# Patient Record
Sex: Male | Born: 1960 | Race: White | Hispanic: No | Smoking: Former smoker
Health system: Southern US, Community
[De-identification: ages and names within clinical notes are randomized; demographics above are authoritative.]

## PROBLEM LIST (undated history)

## (undated) DIAGNOSIS — J449 Chronic obstructive pulmonary disease, unspecified: Secondary | ICD-10-CM

## (undated) DIAGNOSIS — E785 Hyperlipidemia, unspecified: Secondary | ICD-10-CM

## (undated) DIAGNOSIS — J45909 Unspecified asthma, uncomplicated: Secondary | ICD-10-CM

## (undated) DIAGNOSIS — J439 Emphysema, unspecified: Secondary | ICD-10-CM

## (undated) HISTORY — DX: Emphysema, unspecified: J43.9

## (undated) HISTORY — DX: Hyperlipidemia, unspecified: E78.5

## (undated) HISTORY — PX: CARDIAC CATHETERIZATION: SHX172

---

## 2014-07-04 HISTORY — PX: COLONOSCOPY W/ BIOPSIES: SHX1374

## 2015-04-25 ENCOUNTER — Emergency Department (HOSPITAL_COMMUNITY): Payer: Self-pay

## 2015-04-25 ENCOUNTER — Emergency Department (HOSPITAL_COMMUNITY)
Admission: EM | Admit: 2015-04-25 | Discharge: 2015-04-25 | Disposition: A | Discharge status: Home / Self Care | Payer: Self-pay | Attending: Emergency Medicine | Admitting: Emergency Medicine

## 2015-04-25 ENCOUNTER — Encounter (HOSPITAL_COMMUNITY): Payer: Self-pay | Admitting: Emergency Medicine

## 2015-04-25 DIAGNOSIS — Z87891 Personal history of nicotine dependence: Secondary | ICD-10-CM | POA: Insufficient documentation

## 2015-04-25 DIAGNOSIS — Z9889 Other specified postprocedural states: Secondary | ICD-10-CM | POA: Insufficient documentation

## 2015-04-25 DIAGNOSIS — J449 Chronic obstructive pulmonary disease, unspecified: Secondary | ICD-10-CM | POA: Insufficient documentation

## 2015-04-25 DIAGNOSIS — R61 Generalized hyperhidrosis: Secondary | ICD-10-CM | POA: Insufficient documentation

## 2015-04-25 DIAGNOSIS — R42 Dizziness and giddiness: Secondary | ICD-10-CM | POA: Insufficient documentation

## 2015-04-25 DIAGNOSIS — R231 Pallor: Secondary | ICD-10-CM | POA: Insufficient documentation

## 2015-04-25 HISTORY — DX: Chronic obstructive pulmonary disease, unspecified: J44.9

## 2015-04-25 HISTORY — DX: Unspecified asthma, uncomplicated: J45.909

## 2015-04-25 LAB — CBC WITH DIFFERENTIAL/PLATELET
BASOS ABS: 0 10*3/uL (ref 0.0–0.1)
BASOS PCT: 0 %
Eosinophils Absolute: 0.3 10*3/uL (ref 0.0–0.7)
Eosinophils Relative: 3 %
HEMATOCRIT: 44.2 % (ref 39.0–52.0)
HEMOGLOBIN: 15.2 g/dL (ref 13.0–17.0)
LYMPHS PCT: 30 %
Lymphs Abs: 3 10*3/uL (ref 0.7–4.0)
MCH: 29.3 pg (ref 26.0–34.0)
MCHC: 34.4 g/dL (ref 30.0–36.0)
MCV: 85.2 fL (ref 78.0–100.0)
Monocytes Absolute: 0.8 10*3/uL (ref 0.1–1.0)
Monocytes Relative: 8 %
NEUTROS ABS: 5.8 10*3/uL (ref 1.7–7.7)
NEUTROS PCT: 59 %
Platelets: 175 10*3/uL (ref 150–400)
RBC: 5.19 MIL/uL (ref 4.22–5.81)
RDW: 12.7 % (ref 11.5–15.5)
WBC: 9.9 10*3/uL (ref 4.0–10.5)

## 2015-04-25 LAB — COMPREHENSIVE METABOLIC PANEL
ALBUMIN: 3.9 g/dL (ref 3.5–5.0)
ALT: 21 U/L (ref 17–63)
AST: 26 U/L (ref 15–41)
Alkaline Phosphatase: 57 U/L (ref 38–126)
Anion gap: 13 (ref 5–15)
BUN: 8 mg/dL (ref 6–20)
CHLORIDE: 104 mmol/L (ref 101–111)
CO2: 24 mmol/L (ref 22–32)
Calcium: 9.6 mg/dL (ref 8.9–10.3)
Creatinine, Ser: 1.07 mg/dL (ref 0.61–1.24)
Glucose, Bld: 124 mg/dL — ABNORMAL HIGH (ref 65–99)
POTASSIUM: 3.7 mmol/L (ref 3.5–5.1)
Sodium: 141 mmol/L (ref 135–145)
Total Bilirubin: 0.2 mg/dL — ABNORMAL LOW (ref 0.3–1.2)
Total Protein: 6.8 g/dL (ref 6.5–8.1)

## 2015-04-25 LAB — LIPASE, BLOOD: LIPASE: 29 U/L (ref 11–51)

## 2015-04-25 MED ORDER — SODIUM CHLORIDE 0.9 % IV BOLUS (SEPSIS)
1000.0000 mL | Freq: Once | INTRAVENOUS | Status: AC
  Administered 2015-04-25: 1000 mL via INTRAVENOUS

## 2015-04-25 NOTE — Discharge Instructions (Signed)
Dizziness John Banks, your blood work, EKG and chest xray today were normal.  Be sure to stay well hydrated. See your primary care doctor within 3 days for close follow up. If symptoms worsen, come back to the ED immediately. Thank you. Dizziness is a common problem. It makes you feel unsteady or lightheaded. You may feel like you are about to pass out (faint). Dizziness can lead to injury if you stumble or fall. Anyone can get dizzy, but dizziness is more common in older adults. This condition can be caused by a number of things, including:  Medicines.  Dehydration.  Illness. HOME CARE Following these instructions may help with your condition: Eating and Drinking  Drink enough fluid to keep your pee (urine) clear or pale yellow. This helps to keep you from getting dehydrated. Try to drink more clear fluids, such as water.  Do not drink alcohol.  Limit how much caffeine you drink or eat if told by your doctor.  Limit how much salt you drink or eat if told by your doctor. Activity  Avoid making quick movements.  When you stand up from sitting in a chair, steady yourself until you feel okay.  In the morning, first sit up on the side of the bed. When you feel okay, stand slowly while you hold onto something. Do this until you know that your balance is fine.  Move your legs often if you need to stand in one place for a long time. Tighten and relax your muscles in your legs while you are standing.  Do not drive or use heavy machinery if you feel dizzy.  Avoid bending down if you feel dizzy. Place items in your home so that they are easy for you to reach without leaning over. Lifestyle  Do not use any tobacco products, including cigarettes, chewing tobacco, or electronic cigarettes. If you need help quitting, ask your doctor.  Try to lower your stress level, such as with yoga or meditation. Talk with your doctor if you need help. General Instructions  Watch your dizziness for any  changes.  Take medicines only as told by your doctor. Talk with your doctor if you think that your dizziness is caused by a medicine that you are taking.  Tell a friend or a family member that you are feeling dizzy. If he or she notices any changes in your behavior, have this person call your doctor.  Keep all follow-up visits as told by your doctor. This is important. GET HELP IF:  Your dizziness does not go away.  Your dizziness or light-headedness gets worse.  You feel sick to your stomach (nauseous).  You have trouble hearing.  You have new symptoms.  You are unsteady on your feet or you feel like the room is spinning. GET HELP RIGHT AWAY IF:  You throw up (vomit) or have diarrhea and are unable to eat or drink anything.  You have trouble:  Talking.  Walking.  Swallowing.  Using your arms, hands, or legs.  You feel generally weak.  You are not thinking clearly or you have trouble forming sentences. It may take a friend or family member to notice this.  You have:  Chest pain.  Pain in your belly (abdomen).  Shortness of breath.  Sweating.  Your vision changes.  You are bleeding.  You have a headache.  You have neck pain or a stiff neck.  You have a fever.   This information is not intended to replace advice given to  you by your health care provider. Make sure you discuss any questions you have with your health care provider.   Document Released: 12/09/2010 Document Revised: 05/06/2014 Document Reviewed: 12/16/2013 Elsevier Interactive Patient Education [employer].

## 2015-04-25 NOTE — ED Notes (Signed)
Patient was visitor in the next room watching wife gets stitches - began to experience dizziness, diaphoresis, feeling like he was about to pass out. Patient moved to private room, layed down and states he is feeling better. Pt had cardiac cath 4/18 s/p chest pain. Pt A&O x 4.

## 2015-04-25 NOTE — ED Provider Notes (Addendum)
CSN: [id number]     Arrival date & time 04/25/15  7829 History  By signing my name below, I, Phillis Haggis, attest that this documentation has been prepared under the direction and in the presence of Tomasita Crumble, MD. Electronically Signed: Phillis Haggis, ED Scribe. 04/25/2015. 3:42 AM.   Chief Complaint  Patient presents with  . Near Syncope   The history is provided by the patient. No language interpreter was used.  HPI Comments: John Banks is a 55 y.o. Male with a hx of COPD and asthma who presents to the Emergency Department complaining of sudden onset near syncope onset 30 minutes ago. Pt was in the ED with his wife who was receiving stitches. He states that he began to experience dizziness, diaphoresis, pallor and felt like he was going to pass out. He was moved to a separate room in the ED and states that he now feels better. Pt reports hx of cardiac catheterization. He denies other symptoms.   Past Medical History  Diagnosis Date  . COPD (chronic obstructive pulmonary disease) (HCC)   . Asthma    Past Surgical History  Procedure Laterality Date  . Cardiac catheterization     History reviewed. No pertinent family history. Social History  Substance Use Topics  . Smoking status: Former Smoker    Types: Cigarettes    Quit date: 04/19/2014  . Smokeless tobacco: Current User  . Alcohol Use: Yes     Comment: occ    Review of Systems 10 Systems reviewed and all are negative for acute change except as noted in the HPI.  Allergies  Review of patient's allergies indicates no known allergies.  Home Medications   Prior to Admission medications   Not on File   BP 105/75 mmHg  Pulse 76  Temp(Src) 97.6 F (36.4 C) (Oral)  Resp 11  Ht  (1.854 m)  Wt 157 lb (71.215 kg)  BMI 20.72 kg/m2  SpO2 94% Physical Exam  Constitutional: He is oriented to person, place, and time. Vital signs are normal. He appears well-developed and well-nourished.  Non-toxic appearance. He  does not appear ill. No distress.  HENT:  Head: Normocephalic and atraumatic.  Nose: Nose normal.  Mouth/Throat: Oropharynx is clear and moist. No oropharyngeal exudate.  Eyes: Conjunctivae and EOM are normal. Pupils are equal, round, and reactive to light. No scleral icterus.  Neck: Normal range of motion. Neck supple. No tracheal deviation, no edema, no erythema and normal range of motion present. No thyroid mass and no thyromegaly present.  Cardiovascular: Normal rate, regular rhythm, S1 normal, S2 normal, normal heart sounds, intact distal pulses and normal pulses.  Exam reveals no gallop and no friction rub.   No murmur heard. Pulmonary/Chest: Effort normal and breath sounds normal. No respiratory distress. He has no wheezes. He has no rhonchi. He has no rales.  Abdominal: Soft. Normal appearance and bowel sounds are normal. He exhibits no distension, no ascites and no mass. There is no hepatosplenomegaly. There is no tenderness. There is no rebound, no guarding and no CVA tenderness.  Musculoskeletal: Normal range of motion. He exhibits no edema or tenderness.  Lymphadenopathy:    He has no cervical adenopathy.  Neurological: He is alert and oriented to person, place, and time. He has normal strength. No cranial nerve deficit or sensory deficit.  Skin: Skin is warm, dry and intact. No petechiae and no rash noted. He is not diaphoretic. No erythema. No pallor.  Psychiatric: He has a normal mood  and affect. His behavior is normal. Judgment normal.  Nursing note and vitals reviewed.   ED Course  Procedures (including critical care time) DIAGNOSTIC STUDIES: Oxygen Saturation is 94% on RA, adequate by my interpretation.    COORDINATION OF CARE: 3:39 AM-Discussed treatment plan which includes IV fluids with pt at bedside and pt agreed to plan.    Labs Review Labs Reviewed  COMPREHENSIVE METABOLIC PANEL - Abnormal; Notable for the following:    Glucose, Bld 124 (*)    Total Bilirubin  0.2 (*)    All other components within normal limits  CBC WITH DIFFERENTIAL/PLATELET  LIPASE, BLOOD    Imaging Review Dg Chest 2 View  04/25/2015  CLINICAL DATA:  Near syncope. Dizziness in our ago. Recent cardiac catheterization. EXAM: CHEST  2 VIEW COMPARISON:  None. FINDINGS: Normal heart size and pulmonary vascularity. Hyperinflation of the lungs with increased interstitial markings. This could indicate asthma or emphysema with chronic bronchitis. No focal airspace disease or consolidation in the lungs. No blunting of costophrenic angles. No pneumothorax. Mediastinal contours appear intact. IMPRESSION: Hyperinflation of the lungs with increased interstitial markings suggesting asthma or emphysema with chronic bronchitis. Electronically Signed   By: Burman NievesWilliam  Stevens M.D.   On: 04/25/2015 06:00   I have personally reviewed and evaluated these images and lab results as part of my medical decision-making.   EKG Interpretation   Date/Time:  Saturday April 25 2015 03:28:58 EDT Ventricular Rate:  75 PR Interval:  157 QRS Duration: 103 QT Interval:  403 QTC Calculation: 450 R Axis:   91 Text Interpretation:  Sinus rhythm Consider right ventricular hypertrophy  No old tracing to compare Confirmed by Erroll Lunani, Shayma Pfefferle [place]le [phone]([id number]) on  04/25/2015 3:49:24 AM      MDM   Final diagnoses:  None    Patient presents to the emergency department for lightheadedness and dizziness after seeing his wife's laceration repair. He is pale diaphoretic and has heart disease. EKG does not show any ischemia. He was given IV fluids, will obtain chest x-ray. Laboratories is unremarkable.  5:20 AM CXR neg for abnormality. He is back to baseline, appears well and in NAD.  VS remain within his normal limits and he is safe for DC.  I personally performed the services described in this documentation, which was scribed in my presence. The recorded information has been reviewed and is accurate.        Tomasita CrumbleAdeleke  Hema Lanza, MD 04/25/15 [phone]

## 2015-04-29 ENCOUNTER — Ambulatory Visit (INDEPENDENT_AMBULATORY_CARE_PROVIDER_SITE_OTHER): Payer: Self-pay | Admitting: Family Medicine

## 2015-04-29 ENCOUNTER — Encounter (INDEPENDENT_AMBULATORY_CARE_PROVIDER_SITE_OTHER): Payer: Self-pay

## 2015-04-29 ENCOUNTER — Encounter: Payer: Self-pay | Admitting: Family Medicine

## 2015-04-29 VITALS — BP 111/72 | HR 54 | Temp 97.2°F | Ht 73.0 in | Wt 153.8 lb

## 2015-04-29 DIAGNOSIS — E785 Hyperlipidemia, unspecified: Secondary | ICD-10-CM | POA: Insufficient documentation

## 2015-04-29 DIAGNOSIS — Z720 Tobacco use: Secondary | ICD-10-CM

## 2015-04-29 DIAGNOSIS — F17200 Nicotine dependence, unspecified, uncomplicated: Secondary | ICD-10-CM

## 2015-04-29 DIAGNOSIS — I10 Essential (primary) hypertension: Secondary | ICD-10-CM | POA: Insufficient documentation

## 2015-04-29 MED ORDER — SIMVASTATIN 40 MG PO TABS: 40.0000 mg | ORAL_TABLET | Freq: Every day | ORAL | Status: DC

## 2015-04-29 MED ORDER — CHOLESTYRAMINE 4 GM/DOSE PO POWD: 4.0000 g | Freq: Two times a day (BID) | ORAL | Status: DC

## 2015-04-29 MED ORDER — METOPROLOL TARTRATE 25 MG PO TABS: 25.0000 mg | ORAL_TABLET | Freq: Two times a day (BID) | ORAL | Status: DC

## 2015-04-29 NOTE — Progress Notes (Signed)
BP 111/72 mmHg  Pulse 54  Temp(Src) 97.2 F (36.2 C) (Oral)  Ht '6\' 1"'$  (1.854 m)  Wt 153 lb 12.8 oz (69.763 kg)  BMI 20.30 kg/m2   Subjective:    Patient ID: John Banks, male    DOB: 1960-12-23, 55 y.o.   MRN: [id number]  HPI: Damont Balles is a 55 y.o. male presenting on 04/29/2015 for Establish Care   HPI Hypertension checkup Patient is coming in as a new patient to establish care with your office and is here for a blood pressure check. He is currently on metoprolol 25 mg twice daily and denies any issues with it. Patient denies headaches, blurred vision, chest pains, shortness of breath, or weakness. Denies any side effects from medication and is content with current medication. His blood pressure today is 111/72 and his heart rate is 54.  Hyperlipidemia Patient also comes in today to establish care for her cholesterol. He is currently on cholestyramine and simvastatin for his cholesterol. He denies any issues with medication and they have been doing well for him he says for quite some time.  Smoking Patient was a former smoker and quit about 8 or 9 days ago using nicotine patches and hopes to stay that way. Prior to this he smoked 2 packs a day for 40 years and has an 80-pack-year history. He denies any shortness of breath or wheezing.  Relevant past medical, surgical, family and social history reviewed and updated as indicated. Interim medical history since our last visit reviewed. Allergies and medications reviewed and updated.  Review of Systems  Constitutional: Negative for fever and chills.  HENT: Negative for ear discharge and ear pain.   Eyes: Negative for discharge and visual disturbance.  Respiratory: Negative for cough, shortness of breath and wheezing.   Cardiovascular: Negative for chest pain, palpitations and leg swelling.  Gastrointestinal: Negative for abdominal pain, diarrhea and constipation.  Genitourinary: Negative for difficulty urinating.    Musculoskeletal: Negative for back pain and gait problem.  Skin: Negative for rash.  Neurological: Negative for dizziness, syncope, light-headedness and headaches.  All other systems reviewed and are negative.   Per HPI unless specifically indicated above     Medication List       This list is accurate as of: 04/29/15 10:06 AM.  Always use your most recent med list.               cholestyramine 4 GM/DOSE powder  Commonly known as:  QUESTRAN  Take 1 packet (4 g total) by mouth 2 (two) times daily with a meal.     metoprolol tartrate 25 MG tablet  Commonly known as:  LOPRESSOR  Take 1 tablet (25 mg total) by mouth 2 (two) times daily.     nicotine 21 mg/24hr patch  Commonly known as:  NICODERM CQ - dosed in mg/24 hours  Place 21 mg onto the skin daily.     nitroGLYCERIN 0.4 MG SL tablet  Commonly known as:  NITROSTAT  Place 0.4 mg under the tongue every 5 (five) minutes as needed for chest pain.     simvastatin 40 MG tablet  Commonly known as:  ZOCOR  Take 1 tablet (40 mg total) by mouth daily.           Objective:    BP 111/72 mmHg  Pulse 54  Temp(Src) 97.2 F (36.2 C) (Oral)  Ht '6\' 1"'$  (1.854 m)  Wt 153 lb 12.8 oz (69.763 kg)  BMI 20.30 kg/m2  Wt  Readings from Last 3 Encounters:  04/29/15 153 lb 12.8 oz (69.763 kg)  04/25/15 157 lb (71.215 kg)    Physical Exam  Constitutional: He is oriented to person, place, and time. He appears well-developed and well-nourished. No distress.  Eyes: Conjunctivae and EOM are normal. Pupils are equal, round, and reactive to light. Right eye exhibits no discharge. No scleral icterus.  Neck: Neck supple. No thyromegaly present.  Cardiovascular: Normal rate, regular rhythm, normal heart sounds and intact distal pulses.   No murmur heard. Pulmonary/Chest: Effort normal and breath sounds normal. No respiratory distress. He has no wheezes.  Musculoskeletal: Normal range of motion. He exhibits no edema.  Lymphadenopathy:    He  has no cervical adenopathy.  Neurological: He is alert and oriented to person, place, and time. Coordination normal.  Skin: Skin is warm and dry. No rash noted. He is not diaphoretic.  Psychiatric: He has a normal mood and affect. His behavior is normal.  Vitals reviewed.   Results for orders placed or performed during the hospital encounter of 04/25/15  CBC with Differential/Platelet  Result Value Ref Range   WBC 9.9 4.0 - 10.5 K/uL   RBC 5.19 4.22 - 5.81 MIL/uL   Hemoglobin 15.2 13.0 - 17.0 g/dL   HCT 44.2 39.0 - 52.0 %   MCV 85.2 78.0 - 100.0 fL   MCH 29.3 26.0 - 34.0 pg   MCHC 34.4 30.0 - 36.0 g/dL   RDW 12.7 11.5 - 15.5 %   Platelets 175 150 - 400 K/uL   Neutrophils Relative % 59 %   Neutro Abs 5.8 1.7 - 7.7 K/uL   Lymphocytes Relative 30 %   Lymphs Abs 3.0 0.7 - 4.0 K/uL   Monocytes Relative 8 %   Monocytes Absolute 0.8 0.1 - 1.0 K/uL   Eosinophils Relative 3 %   Eosinophils Absolute 0.3 0.0 - 0.7 K/uL   Basophils Relative 0 %   Basophils Absolute 0.0 0.0 - 0.1 K/uL  Comprehensive metabolic panel  Result Value Ref Range   Sodium 141 135 - 145 mmol/L   Potassium 3.7 3.5 - 5.1 mmol/L   Chloride 104 101 - 111 mmol/L   CO2 24 22 - 32 mmol/L   Glucose, Bld 124 (H) 65 - 99 mg/dL   BUN 8 6 - 20 mg/dL   Creatinine, Ser 1.07 0.61 - 1.24 mg/dL   Calcium 9.6 8.9 - 10.3 mg/dL   Total Protein 6.8 6.5 - 8.1 g/dL   Albumin 3.9 3.5 - 5.0 g/dL   AST 26 15 - 41 U/L   ALT 21 17 - 63 U/L   Alkaline Phosphatase 57 38 - 126 U/L   Total Bilirubin 0.2 (L) 0.3 - 1.2 mg/dL   GFR calc non Af Amer >60 >60 mL/min   GFR calc Af Amer >60 >60 mL/min   Anion gap 13 5 - 15  Lipase, blood  Result Value Ref Range   Lipase 29 11 - 51 U/L      Assessment & Plan:   Problem List Items Addressed This Visit      Cardiovascular and Mediastinum   Essential hypertension, benign - Primary   Relevant Medications   nitroGLYCERIN (NITROSTAT) 0.4 MG SL tablet   cholestyramine (QUESTRAN) 4 GM/DOSE  powder   metoprolol tartrate (LOPRESSOR) 25 MG tablet   simvastatin (ZOCOR) 40 MG tablet   Other Relevant Orders   CMP14+EGFR     Other   Hyperlipidemia LDL goal <130   Relevant Medications  nitroGLYCERIN (NITROSTAT) 0.4 MG SL tablet   cholestyramine (QUESTRAN) 4 GM/DOSE powder   metoprolol tartrate (LOPRESSOR) 25 MG tablet   simvastatin (ZOCOR) 40 MG tablet   Other Relevant Orders   Lipid panel    Other Visit Diagnoses    Smoking            Follow up plan: Return if symptoms worsen or fail to improve.  Counseling provided for all of the vaccine components Orders Placed This Encounter  Procedures  . CMP14+EGFR  . Lipid panel    Caryl Pina, MD [facility] 04/29/2015, 10:06 AM

## 2015-05-05 ENCOUNTER — Telehealth: Payer: Self-pay | Admitting: Family Medicine

## 2015-05-05 NOTE — Telephone Encounter (Signed)
rx was switched from packets to the can of powder for patient.

## 2015-07-01 ENCOUNTER — Other Ambulatory Visit: Payer: Self-pay | Admitting: *Deleted

## 2015-07-01 DIAGNOSIS — I10 Essential (primary) hypertension: Secondary | ICD-10-CM

## 2015-07-01 DIAGNOSIS — E785 Hyperlipidemia, unspecified: Secondary | ICD-10-CM

## 2015-07-01 MED ORDER — METOPROLOL TARTRATE 25 MG PO TABS: 25.0000 mg | ORAL_TABLET | Freq: Two times a day (BID) | ORAL | Status: DC

## 2015-07-01 MED ORDER — SIMVASTATIN 40 MG PO TABS: 40.0000 mg | ORAL_TABLET | Freq: Every day | ORAL | Status: DC

## 2015-08-23 ENCOUNTER — Other Ambulatory Visit: Payer: Self-pay | Admitting: Family Medicine

## 2015-08-23 DIAGNOSIS — I10 Essential (primary) hypertension: Secondary | ICD-10-CM

## 2015-08-30 ENCOUNTER — Emergency Department (HOSPITAL_COMMUNITY): Payer: Self-pay

## 2015-08-30 ENCOUNTER — Inpatient Hospital Stay (HOSPITAL_COMMUNITY)
Admission: EM | Admit: 2015-08-30 | Discharge: 2015-09-01 | DRG: 369 | Disposition: A | Discharge status: Home / Self Care | Payer: Self-pay | Attending: Family Medicine | Admitting: Family Medicine

## 2015-08-30 ENCOUNTER — Encounter (HOSPITAL_COMMUNITY): Payer: Self-pay | Admitting: Emergency Medicine

## 2015-08-30 DIAGNOSIS — A09 Infectious gastroenteritis and colitis, unspecified: Secondary | ICD-10-CM | POA: Diagnosis present

## 2015-08-30 DIAGNOSIS — K226 Gastro-esophageal laceration-hemorrhage syndrome: Principal | ICD-10-CM | POA: Diagnosis present

## 2015-08-30 DIAGNOSIS — R1110 Vomiting, unspecified: Secondary | ICD-10-CM

## 2015-08-30 DIAGNOSIS — E785 Hyperlipidemia, unspecified: Secondary | ICD-10-CM | POA: Diagnosis present

## 2015-08-30 DIAGNOSIS — D751 Secondary polycythemia: Secondary | ICD-10-CM | POA: Diagnosis present

## 2015-08-30 DIAGNOSIS — I10 Essential (primary) hypertension: Secondary | ICD-10-CM | POA: Diagnosis present

## 2015-08-30 DIAGNOSIS — K920 Hematemesis: Secondary | ICD-10-CM

## 2015-08-30 DIAGNOSIS — K922 Gastrointestinal hemorrhage, unspecified: Secondary | ICD-10-CM | POA: Diagnosis present

## 2015-08-30 DIAGNOSIS — F17210 Nicotine dependence, cigarettes, uncomplicated: Secondary | ICD-10-CM | POA: Diagnosis present

## 2015-08-30 DIAGNOSIS — Z7982 Long term (current) use of aspirin: Secondary | ICD-10-CM

## 2015-08-30 DIAGNOSIS — D72829 Elevated white blood cell count, unspecified: Secondary | ICD-10-CM | POA: Diagnosis present

## 2015-08-30 DIAGNOSIS — N179 Acute kidney failure, unspecified: Secondary | ICD-10-CM | POA: Diagnosis present

## 2015-08-30 DIAGNOSIS — R17 Unspecified jaundice: Secondary | ICD-10-CM | POA: Diagnosis present

## 2015-08-30 DIAGNOSIS — K625 Hemorrhage of anus and rectum: Secondary | ICD-10-CM | POA: Diagnosis present

## 2015-08-30 DIAGNOSIS — J449 Chronic obstructive pulmonary disease, unspecified: Secondary | ICD-10-CM | POA: Diagnosis present

## 2015-08-30 DIAGNOSIS — E860 Dehydration: Secondary | ICD-10-CM | POA: Diagnosis present

## 2015-08-30 DIAGNOSIS — K52839 Microscopic colitis, unspecified: Secondary | ICD-10-CM | POA: Diagnosis present

## 2015-08-30 LAB — COMPREHENSIVE METABOLIC PANEL
ALBUMIN: 4.4 g/dL (ref 3.5–5.0)
ALK PHOS: 65 U/L (ref 38–126)
ALT: 24 U/L (ref 17–63)
ANION GAP: 12 (ref 5–15)
AST: 32 U/L (ref 15–41)
BUN: 13 mg/dL (ref 6–20)
CO2: 19 mmol/L — AB (ref 22–32)
Calcium: 9.4 mg/dL (ref 8.9–10.3)
Chloride: 108 mmol/L (ref 101–111)
Creatinine, Ser: 1.71 mg/dL — ABNORMAL HIGH (ref 0.61–1.24)
GFR calc Af Amer: 51 mL/min — ABNORMAL LOW (ref >60)
GFR calc non Af Amer: 44 mL/min — ABNORMAL LOW (ref >60)
GLUCOSE: 182 mg/dL — AB (ref 65–99)
POTASSIUM: 4.3 mmol/L (ref 3.5–5.1)
SODIUM: 139 mmol/L (ref 135–145)
TOTAL PROTEIN: 7.5 g/dL (ref 6.5–8.1)
Total Bilirubin: 1.3 mg/dL — ABNORMAL HIGH (ref 0.3–1.2)

## 2015-08-30 LAB — POC OCCULT BLOOD, ED: Fecal Occult Bld: POSITIVE — AB

## 2015-08-30 LAB — CBC
HEMATOCRIT: 58.5 % — AB (ref 39.0–52.0)
HEMOGLOBIN: 20.2 g/dL — AB (ref 13.0–17.0)
MCH: 30.1 pg (ref 26.0–34.0)
MCHC: 34.5 g/dL (ref 30.0–36.0)
MCV: 87.3 fL (ref 78.0–100.0)
Platelets: 228 10*3/uL (ref 150–400)
RBC: 6.7 MIL/uL — ABNORMAL HIGH (ref 4.22–5.81)
RDW: 13.3 % (ref 11.5–15.5)
WBC: 24.1 10*3/uL — ABNORMAL HIGH (ref 4.0–10.5)

## 2015-08-30 MED ORDER — ONDANSETRON HCL 4 MG/2ML IJ SOLN
INTRAMUSCULAR | Status: AC
  Administered 2015-08-30: 4 mg
  Filled 2015-08-30: qty 2

## 2015-08-30 MED ORDER — SODIUM CHLORIDE 0.9 % IV SOLN
INTRAVENOUS | Status: DC
Start: 2015-08-31 — End: 2015-09-01
  Administered 2015-08-30 – 2015-08-31 (×4): via INTRAVENOUS

## 2015-08-30 MED ORDER — PIPERACILLIN-TAZOBACTAM 3.375 G IVPB 30 MIN
3.3750 g | Freq: Once | INTRAVENOUS | Status: AC
  Administered 2015-08-30: 3.375 g via INTRAVENOUS
  Filled 2015-08-30: qty 50

## 2015-08-30 MED ORDER — IOPAMIDOL (ISOVUE-300) INJECTION 61%
INTRAVENOUS | Status: AC
  Administered 2015-08-30: 80 mL
  Filled 2015-08-30: qty 100

## 2015-08-30 MED ORDER — SODIUM CHLORIDE 0.9 % IV BOLUS (SEPSIS)
1000.0000 mL | Freq: Once | INTRAVENOUS | Status: AC
  Administered 2015-08-30: 1000 mL via INTRAVENOUS

## 2015-08-30 NOTE — ED Notes (Signed)
PEr admitting Physician, do not draw blood cultures at this time.

## 2015-08-30 NOTE — H&P (Signed)
History and Physical  Patient Name: John Banks     ZOX:[id number]    DOB: [birth year]    DOA: 08/30/2015 PCP: Elige Radon Dettinger, MD   Patient coming from: Home  Chief Complaint: Hematochezia and vomiting  HPI: John Banks is a 55 y.o. male with a past medical history significant for microscopic colitis and HTN who presents with 1 day bright red rectal bleeding.  The patient was in his usual state of health (when biking with his wife yesterday) until this morning, he took his morning cholestyramine dose, felt nauseous afterwards and then vomited several times and BNP emesis, followed by several emesis with streaks of red blood. He subsequently had several watery, bright red bloodly bowel movements.  Then his wife convinced him to come to the hospital.  ED course: -Afebrile, heart rate 80s and 90s, BP 110s/60s and O2 saturation normal -Na 139, K 4.3, BUN normal, Cr 1.7 (baseline 1.0), WBC 24K, Hgb 20 -FOBT was positive and bowel movements were observed to have red blood -CXR and abdominal radiograph were negative -CT of the abdomen and pelvis with IV contrast was given and showd nothing. -Case was discussed with GI and TRH were asked to evaluate for admission  The patient reported eating [employer] 3-4 weeks ago that made him violently ill (vomiting and non-bloody diarrhea) and since then has not had much appetite.  He did have some buffalo shrimp at a restaurant this week.  He lives in [place] and drinks well water. No farm exposure personally, no other sick contacts.  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    No family history of IBD or autoimmune disease that he knows of.  He had a colonoscopy at [facility] one year ago because of chronic non-bloody diarrhea.  Pathology report is available in CareEverywhere and is reported as "microscopic colitis" for which he takes cholestyramine that he states has helped.  He has no abdominal pain (other than today after  vomiting/diarrhea) and no weight loss or B-type symptoms.  The colonoscopy report from last year is not available to see if he had diverticuli.   ROS: Review of Systems  Constitutional: Negative for chills, diaphoresis, fever, malaise/fatigue and weight loss (rather, weight gain is reported).  Gastrointestinal: Positive for blood in stool, diarrhea, nausea and vomiting. Negative for abdominal pain and melena (nor coffee ground emesis).  Skin: Negative for itching and rash.  Neurological: Negative for weakness.  All other systems reviewed and are negative.       Past Medical History:  Diagnosis Date  . Asthma   . COPD (chronic obstructive pulmonary disease) (HCC)   . Emphysema of lung (HCC)   . Hyperlipidemia     Past Surgical History:  Procedure Laterality Date  . CARDIAC CATHETERIZATION      Social History: Patient lives with his wife and her daughter.  The patient walks unassisted.  He smokes.  He drives a truck, in [place].  He has no new sexual partners.  No IVDU.  Lives in [place].  Allergies  Allergen Reactions  . Sulfa Antibiotics Anaphylaxis, Shortness Of Breath and Nausea And Vomiting    Patient stated that he was also "paralyzed" and had to be hospitalized    Family history: family history includes Diabetes in his father; Leukemia in his sister; Stroke in his maternal grandfather.  Prior to Admission medications   Medication Sig Start Date End Date Taking? Authorizing Provider  cholestyramine (QUESTRAN) 4 GM/DOSE powder MIX 4  GRAMS( 1 SCOOP) IN LIQUID 2 TIMES A DAY WITH A MEAL 08/24/15  Yes Elige Radon Dettinger, MD  simvastatin (ZOCOR) 40 MG tablet Take 1 tablet (40 mg total) by mouth daily. 07/01/15  Yes Elige Radon Dettinger, MD  metoprolol tartrate (LOPRESSOR) 25 MG tablet Take 1 tablet (25 mg total) by mouth 2 (two) times daily. Patient not taking: Reported on 08/30/2015 07/01/15   Elige Radon Dettinger, MD       Physical Exam: BP 103/84   Pulse 91   Temp 97.2 F  (36.2 C) (Axillary)   Resp 17   Ht 6\' 1"  (1.854 m)   Wt 69.4 kg (153 lb)   SpO2 96%   BMI 20.19 kg/m  General appearance: Thin adult male, alert and in no acute distress, appears comfortable and self-transfers from bed easily.   Eyes: Anicteric, conjunctiva pink, lids and lashes normal.     ENT: No nasal deformity, discharge, or epistaxis.  OP moist without lesions.   Lymph: No cervical or supraclavicular lymphadenopathy. Skin: Warm and dry.  No jaundice.  No suspicious rashes or lesions. Cardiac: RRR, nl S1-S2, no murmurs appreciated.  Capillary refill is brisk.  JVP normal.  No LE edema.  Radial and DP pulses 2+ and symmetric. Respiratory: Normal respiratory rate and rhythm.  CTAB without rales or wheezes. GI: Abdomen soft without rigidity.  No TTP without guarding or rebound. No ascites, distension, hepatosplenomegaly.   MSK: No deformities or effusions.  No clubbing/cyanosis. Neuro: Sensorium intact and responding to questions, attention normal.  Speech is fluent.  Moves all extremities equally and with normal coordination.    Psych: Affect normal.  Judgment and insight appear normal.       Labs on Admission:  I have personally reviewed following labs and imaging studies: CBC:  Recent Labs Lab 08/30/15 1943  WBC 24.1*  HGB 20.2*  HCT 58.5*  MCV 87.3  PLT 228   Basic Metabolic Panel:  Recent Labs Lab 08/30/15 1943  NA 139  K 4.3  CL 108  CO2 19*  GLUCOSE 182*  BUN 13  CREATININE 1.71*  CALCIUM 9.4   GFR: Estimated Creatinine Clearance: 48.5 mL/min (by C-G formula based on SCr of 1.71 mg/dL).  Liver Function Tests:  Recent Labs Lab 08/30/15 1943  AST 32  ALT 24  ALKPHOS 65  BILITOT 1.3*  PROT 7.5  ALBUMIN 4.4         Radiological Exams on Admission: Personally reviewed: Ct Abdomen Pelvis W Contrast  Result Date: 08/30/2015 CLINICAL DATA:  Hematemesis, onset at 12:00.  Diarrhea. EXAM: CT ABDOMEN AND PELVIS WITH CONTRAST TECHNIQUE: Multidetector  CT imaging of the abdomen and pelvis was performed using the standard protocol following bolus administration of intravenous contrast. CONTRAST:  80mL ISOVUE-300 IOPAMIDOL (ISOVUE-300) INJECTION 61% COMPARISON:  Radiographs 08/30/2015 FINDINGS: Lower chest:  No significant abnormality Hepatobiliary: There are normal appearances of the liver, gallbladder and bile ducts. Pancreas: Normal Spleen: Norm Adrenals/Urinary Tract: The adrenals and kidneys are normal in appearance. There is no urinary calculus evident. There is no hydronephrosis or ureteral dilatation. Collecting systems and ureters appear unremarkable. Stomach/Bowel: There are normal appearances of the stomach, small bowel and colon. The appendix is normal. Vascular/Lymphatic: The abdominal aorta is normal in caliber. There is mild atherosclerotic calcification. There is no adenopathy in the abdomen or pelvis. Reproductive: Unremarkable Other: No acute inflammatory changes are evident in the abdomen or pelvis. There is no ascites. Musculoskeletal: No significant skeletal lesion. IMPRESSION: No significant abnormality. Electronically  Signed   By: Ellery Plunkaniel R Mitchell M.D.   On: 08/30/2015 23:31   Dg Abdomen Acute W/chest  Result Date: 08/30/2015 CLINICAL DATA:  Nausea vomiting and diarrhea today with tingling and numbness. Syncope. EXAM: DG ABDOMEN ACUTE W/ 1V CHEST COMPARISON:  04/25/2015 FINDINGS: Lungs are hyperexpanded. The lungs are clear wiithout focal pneumonia, edema, pneumothorax or pleural effusion. Interstitial markings are diffusely coarsened with chronic features. The cardiopericardial silhouette is within normal limits for size. Decubitus view the abdomen shows no intraperitoneal free air. Supine film shows normal bowel gas pattern. Visualized bony anatomy is unremarkable. IMPRESSION: Negative abdominal radiographs.  No acute cardiopulmonary disease. Electronically Signed   By: Kennith CenterEric  Mansell M.D.   On: 08/30/2015 21:44         Assessment/Plan 1. Hematochezia:  Suspect infectious colitis (food exposures, vomiting and diarrhea with hematochezia).  Other differential includes IBD and diverticulosis.  CT abdomen negative.  Very low suspicion for upper GI bleeding.  Appears hemodynamically stable at present.  -GI pathogen panel -Given elevated creatinine, will wait until GI pathogen panel returns and then start antibiotics if negative for E coli -Enteric precautions -IVF -Ondansetron PRN -Consult to GI, appreciate cares -Clears until seen by GI   2. Elevated creatinine:  Likely from dehydration given history. -Check urine electrolytes -Check urinalysis -Fluids -Trend BMP  3. Microscopic colitis:  -Continue home cholestyramine  4. HTN:  -Continue home metropolol and simvastatin  5. Leukocytosis:  Possibly hemoconcentration.  Based on the information available to me at this time, I do not believe the patient has sepsis.    6. Elevated bilirubin:  Unclear etiology, mild. -Trend CMP -Check fractionated bilirubin  7. Elevated hemoglobin:  Suspect hemoconcentration.  No itching. -Repeat Hgb tomorrow     DVT prophylaxis: SCDs given hematochezia  Code Status: FULL  Family Communication: Wife at bedside  Disposition Plan: Anticipate IV fluids, GI panel and supportive cares.  Further disposition pending resolution of AKI. Consults called: GI Admission status: OBS, med surg At the point of initial evaluation, it is my clinical opinion that admission for OBSERVATION is reasonable and necessary because the patient's presenting complaints in the context of their chronic conditions represent sufficient risk of deterioration or significant morbidity to constitute reasonable grounds for close observation in the hospital setting, but that the patient may be medically stable for discharge from the hospital within 24 to 48 hours.    Medical decision making: Patient seen at 11:20 PM on 08/30/2015.  The  patient was discussed with Dr. Ruthell RummageLuckey. What exists of the patient's chart and [facility] were reviewed in depth.  Clinical condition: stable at present, with normal HR, BP stabilized, mentating well and appears comfortable.        Alberteen SamChristopher P Day Greb [facility] Pager [phone]

## 2015-08-30 NOTE — ED Triage Notes (Signed)
EMS brought pt in for gi bleeding. Pt states 15-20 stools today along with bloody emesis. Pt also states "impending doom. S/s started 0600 this morning and was refusing to come to hospital earlier today

## 2015-08-30 NOTE — ED Notes (Signed)
Patient transported to CT 

## 2015-08-30 NOTE — ED Provider Notes (Signed)
[facility]-EMERGENCY DEPT Provider Note   CSN: [id number] Arrival date & time: 08/30/15  1936     History   Chief Complaint Chief Complaint  Patient presents with  . GI Bleeding  . Anxiety    HPI John Banks is a 55 y.o. male.  HPI  Past Medical History:  Diagnosis Date  . Asthma   . COPD (chronic obstructive pulmonary disease) (HCC)   . Emphysema of lung (HCC)   . Hyperlipidemia     Patient Active Problem List   Diagnosis Date Noted  . Hematemesis with nausea   . Uncontrollable vomiting   . GI bleed 08/31/2015  . Rectal bleeding 08/30/2015  . Microscopic colitis 08/30/2015  . AKI (acute kidney injury) (HCC) 08/30/2015  . Elevated bilirubin 08/30/2015  . Leukocytosis 08/30/2015  . Essential hypertension, benign 04/29/2015  . Hyperlipidemia LDL goal <130 04/29/2015    Past Surgical History:  Procedure Laterality Date  . CARDIAC CATHETERIZATION    . COLONOSCOPY W/ BIOPSIES  07/2014   dr Harlen Labs at [facility]. For diarrhea.  biopsies revealed microscopic colitis.        Home Medications    Prior to Admission medications   Medication Sig Start Date End Date Taking? Authorizing Provider  acetaminophen (TYLENOL) 325 MG tablet Take 325-650 mg by mouth every 6 (six) hours as needed for mild pain.   Yes Historical Provider, MD  cholestyramine (QUESTRAN) 4 GM/DOSE powder MIX 4 GRAMS( 1 SCOOP) IN LIQUID 2 TIMES A DAY WITH A MEAL 08/24/15  Yes Elige Radon Dettinger, MD  nitroGLYCERIN (NITROSTAT) 0.4 MG SL tablet Place 0.4 mg under the tongue every 5 (five) minutes as needed for chest pain.   Yes Historical Provider, MD  simvastatin (ZOCOR) 40 MG tablet Take 1 tablet (40 mg total) by mouth daily. 07/01/15  Yes Elige Radon Dettinger, MD  metoprolol tartrate (LOPRESSOR) 25 MG tablet Take 1 tablet (25 mg total) by mouth 2 (two) times daily. Patient not taking: Reported on 08/30/2015 07/01/15   Elige Radon Dettinger, MD  pantoprazole (PROTONIX) 20 MG tablet Take 1 tablet (20 mg total)  by mouth daily. 09/01/15   Tyrone Nine, MD    Family History Family History  Problem Relation Age of Onset  . Stroke Maternal Grandfather   . Diabetes Father   . Leukemia Sister   . Autoimmune disease Neg Hx     Social History Social History  Substance Use Topics  . Smoking status: Current Every Day Smoker    Packs/day: 2.00    Years: 40.00    Types: Cigarettes    Last attempt to quit: 04/21/2015  . Smokeless tobacco: Never Used  . Alcohol use 7.2 oz/week    12 Cans of beer per week     Comment: per week     Allergies   Sulfa antibiotics   Review of Systems Review of Systems  Constitutional: Negative for chills and fever.  Respiratory: Negative for chest tightness and shortness of breath.   Cardiovascular: Negative for chest pain.  Gastrointestinal: Positive for abdominal pain, blood in stool, diarrhea, nausea and vomiting.  Neurological: Negative for dizziness and light-headedness.  Psychiatric/Behavioral: Positive for agitation.  All other systems reviewed and are negative.    Physical Exam Updated Vital Signs BP 110/72 (BP Location: Left Arm)   Pulse (!) 57   Temp 97.8 F (36.6 C)   Resp 18   Ht 6\' 1"  (1.854 m)   Wt 69.4 kg   SpO2 96%   BMI  20.19 kg/m   Physical Exam  Constitutional: He appears well-developed and well-nourished.  HENT:  Head: Normocephalic and atraumatic.  Eyes: Conjunctivae are normal.  Neck: Neck supple.  Cardiovascular: Normal rate and regular rhythm.   No murmur heard. Pulmonary/Chest: Effort normal and breath sounds normal. No respiratory distress.  Abdominal: Soft. He exhibits no distension. There is tenderness. There is no rebound and no guarding.  Musculoskeletal: He exhibits no edema.  Neurological: He is alert.  Skin: Skin is warm and dry.  Psychiatric: He has a normal mood and affect.  Nursing note and vitals reviewed.    ED Treatments / Results  Labs (all labs ordered are listed, but only abnormal results are  displayed) Labs Reviewed  COMPREHENSIVE METABOLIC PANEL - Abnormal; Notable for the following:       Result Value   CO2 19 (*)    Glucose, Bld 182 (*)    Creatinine, Ser 1.71 (*)    Total Bilirubin 1.3 (*)    GFR calc non Af Amer 44 (*)    GFR calc Af Amer 51 (*)    All other components within normal limits  CBC - Abnormal; Notable for the following:    WBC 24.1 (*)    RBC 6.70 (*)    Hemoglobin 20.2 (*)    HCT 58.5 (*)    All other components within normal limits  URINALYSIS, ROUTINE W REFLEX MICROSCOPIC (NOT AT [facility]) - Abnormal; Notable for the following:    Ketones, ur 15 (*)    All other components within normal limits  COMPREHENSIVE METABOLIC PANEL - Abnormal; Notable for the following:    CO2 20 (*)    Glucose, Bld 123 (*)    Creatinine, Ser 1.32 (*)    Calcium 8.3 (*)    Total Protein 5.1 (*)    Albumin 3.2 (*)    GFR calc non Af Amer 60 (*)    All other components within normal limits  CBC - Abnormal; Notable for the following:    WBC 14.1 (*)    All other components within normal limits  C-REACTIVE PROTEIN - Abnormal; Notable for the following:    CRP 5.2 (*)    All other components within normal limits  BASIC METABOLIC PANEL - Abnormal; Notable for the following:    Chloride 112 (*)    Calcium 8.7 (*)    All other components within normal limits  POC OCCULT BLOOD, ED - Abnormal; Notable for the following:    Fecal Occult Bld POSITIVE (*)    All other components within normal limits  GASTROINTESTINAL PANEL BY PCR, STOOL (REPLACES STOOL CULTURE)  PROTIME-INR  SODIUM, URINE, RANDOM  CREATININE, URINE, RANDOM  PROTIME-INR  SEDIMENTATION RATE  CBC    EKG  EKG Interpretation None       Radiology Ct Abdomen Pelvis W Contrast  Result Date: 08/30/2015 CLINICAL DATA:  Hematemesis, onset at 12:00.  Diarrhea. EXAM: CT ABDOMEN AND PELVIS WITH CONTRAST TECHNIQUE: Multidetector CT imaging of the abdomen and pelvis was performed using the standard protocol  following bolus administration of intravenous contrast. CONTRAST:  80mL ISOVUE-300 IOPAMIDOL (ISOVUE-300) INJECTION 61% COMPARISON:  Radiographs 08/30/2015 FINDINGS: Lower chest:  No significant abnormality Hepatobiliary: There are normal appearances of the liver, gallbladder and bile ducts. Pancreas: Normal Spleen: Norm Adrenals/Urinary Tract: The adrenals and kidneys are normal in appearance. There is no urinary calculus evident. There is no hydronephrosis or ureteral dilatation. Collecting systems and ureters appear unremarkable. Stomach/Bowel: There are normal appearances of  the stomach, small bowel and colon. The appendix is normal. Vascular/Lymphatic: The abdominal aorta is normal in caliber. There is mild atherosclerotic calcification. There is no adenopathy in the abdomen or pelvis. Reproductive: Unremarkable Other: No acute inflammatory changes are evident in the abdomen or pelvis. There is no ascites. Musculoskeletal: No significant skeletal lesion. IMPRESSION: No significant abnormality. Electronically Signed   By: Ellery Plunkaniel R Mitchell M.D.   On: 08/30/2015 23:31   Dg Abdomen Acute W/chest  Result Date: 08/30/2015 CLINICAL DATA:  Nausea vomiting and diarrhea today with tingling and numbness. Syncope. EXAM: DG ABDOMEN ACUTE W/ 1V CHEST COMPARISON:  04/25/2015 FINDINGS: Lungs are hyperexpanded. The lungs are clear wiithout focal pneumonia, edema, pneumothorax or pleural effusion. Interstitial markings are diffusely coarsened with chronic features. The cardiopericardial silhouette is within normal limits for size. Decubitus view the abdomen shows no intraperitoneal free air. Supine film shows normal bowel gas pattern. Visualized bony anatomy is unremarkable. IMPRESSION: Negative abdominal radiographs.  No acute cardiopulmonary disease. Electronically Signed   By: Kennith CenterEric  Mansell M.D.   On: 08/30/2015 21:44    Procedures Procedures (including critical care time)  Medications Ordered in ED Medications    metoprolol tartrate (LOPRESSOR) tablet 25 mg (25 mg Oral Given 09/01/15 1124)  simvastatin (ZOCOR) tablet 40 mg ( Oral MAR Unhold 09/01/15 1057)  0.9 %  sodium chloride infusion ( Intravenous Anesthesia Volume Adjustment 09/01/15 1019)  acetaminophen (TYLENOL) tablet 650 mg ( Oral MAR Unhold 09/01/15 1057)    Or  acetaminophen (TYLENOL) suppository 650 mg ( Rectal MAR Unhold 09/01/15 1057)  ondansetron (ZOFRAN) tablet 4 mg ( Oral MAR Unhold 09/01/15 1057)    Or  ondansetron (ZOFRAN) injection 4 mg ( Intravenous MAR Unhold 09/01/15 1057)  cholestyramine (QUESTRAN) packet 4 g (4 g Oral Given 09/01/15 1128)  nicotine (NICODERM CQ - dosed in mg/24 hours) patch 21 mg (21 mg Transdermal Patch Applied 09/01/15 1123)  pantoprazole (PROTONIX) EC tablet 20 mg (20 mg Oral Given 09/01/15 1124)  sodium chloride 0.9 % bolus 1,000 mL (0 mLs Intravenous Stopped 08/30/15 2218)  piperacillin-tazobactam (ZOSYN) IVPB 3.375 g (0 g Intravenous Stopped 08/30/15 2308)  iopamidol (ISOVUE-300) 61 % injection (80 mLs  Contrast Given 08/30/15 2150)  ondansetron (ZOFRAN) 4 MG/2ML injection (4 mg  Given 08/30/15 2212)  sodium chloride 0.9 % bolus 500 mL (500 mLs Intravenous Given 08/31/15 0112)     Initial Impression / Assessment and Plan / ED Course  I have reviewed the triage vital signs and the nursing notes.  Pertinent labs & imaging results that were available during my care of the patient were reviewed by me and considered in my medical decision making (see chart for details).  Clinical Course   Patient is a 55 year old male past medical history of hypertension, hyperlipidemia who comes in today complaining of hematemesis and blood per rectum.  Patient brought in via EMS.  Patient states approximately 15-20 stools today as well as a feeling of "impending doom."  Patient states his symptoms began approximate 06 this morning.  Patient additionally complains of hematemesis.  Patient states that he had an apple this morning and  became very nauseated.  Patient states he then had a partly 2 episodes of emesis of stomach contents, but shortly thereafter had several episodes of bright red bloody emesis.  Patient denies fevers chills but endorses nausea and vomiting.  Patient denies alcohol use.  Patient denies excessive NSAID use.  Physical exam patient clear to auscultation bilaterally.  Patient is well-appearing.  Patient with  normal S1-S2 no rubs murmurs gallops.  Abdomen soft with mild epigastric tenderness.  Patient is Hemoccult positive.  Patient denies any further episodes of bloody emesis.  Patient had bloody bowel movement while in the ED.  Blood is dark red in color and Hemoccult positive.  Patient's chemistry largely within normal limits with the exception of elevated creatinine with HPI.  Patient CBC appears to be hemoconcentrated.  IMA globin of 20.2, leukocytosis of 24.  CT abdomen shows no obvious acute abnormality.  I spoke with gastroenterology who agreed to see the patient in the morning.  Patient admitted to medicine for further care.    Final Clinical Impressions(s) / ED Diagnoses   Final diagnoses:  Rectal bleeding    New Prescriptions Discharge Medication List as of 09/01/2015  3:19 PM    START taking these medications   Details  pantoprazole (PROTONIX) 20 MG tablet Take 1 tablet (20 mg total) by mouth daily., Starting Tue 09/01/2015, Print         Caren Griffins, MD 09/01/15 1914    Blane Ohara, MD 09/03/15 1400

## 2015-08-31 ENCOUNTER — Encounter (HOSPITAL_COMMUNITY): Payer: Self-pay | Admitting: Physician Assistant

## 2015-08-31 DIAGNOSIS — K922 Gastrointestinal hemorrhage, unspecified: Secondary | ICD-10-CM | POA: Diagnosis present

## 2015-08-31 DIAGNOSIS — K625 Hemorrhage of anus and rectum: Secondary | ICD-10-CM

## 2015-08-31 LAB — COMPREHENSIVE METABOLIC PANEL
ALBUMIN: 3.2 g/dL — AB (ref 3.5–5.0)
ALK PHOS: 45 U/L (ref 38–126)
ALT: 18 U/L (ref 17–63)
AST: 22 U/L (ref 15–41)
Anion gap: 7 (ref 5–15)
BILIRUBIN TOTAL: 0.6 mg/dL (ref 0.3–1.2)
BUN: 14 mg/dL (ref 6–20)
CALCIUM: 8.3 mg/dL — AB (ref 8.9–10.3)
CO2: 20 mmol/L — ABNORMAL LOW (ref 22–32)
CREATININE: 1.32 mg/dL — AB (ref 0.61–1.24)
Chloride: 111 mmol/L (ref 101–111)
GFR, EST NON AFRICAN AMERICAN: 60 mL/min — AB (ref >60)
Glucose, Bld: 123 mg/dL — ABNORMAL HIGH (ref 65–99)
Potassium: 4.3 mmol/L (ref 3.5–5.1)
Sodium: 138 mmol/L (ref 135–145)
Total Protein: 5.1 g/dL — ABNORMAL LOW (ref 6.5–8.1)

## 2015-08-31 LAB — GASTROINTESTINAL PANEL BY PCR, STOOL (REPLACES STOOL CULTURE)

## 2015-08-31 LAB — URINALYSIS, ROUTINE W REFLEX MICROSCOPIC
BILIRUBIN URINE: NEGATIVE
GLUCOSE, UA: NEGATIVE mg/dL
HGB URINE DIPSTICK: NEGATIVE
KETONES UR: 15 mg/dL — AB
Leukocytes, UA: NEGATIVE
Nitrite: NEGATIVE
PH: 5 (ref 5.0–8.0)
PROTEIN: NEGATIVE mg/dL
Specific Gravity, Urine: 1.02 (ref 1.005–1.030)

## 2015-08-31 LAB — CBC
HCT: 46.7 % (ref 39.0–52.0)
Hemoglobin: 15.9 g/dL (ref 13.0–17.0)
MCH: 29.3 pg (ref 26.0–34.0)
MCHC: 34 g/dL (ref 30.0–36.0)
MCV: 86 fL (ref 78.0–100.0)
PLATELETS: 213 10*3/uL (ref 150–400)
RBC: 5.43 MIL/uL (ref 4.22–5.81)
RDW: 13.5 % (ref 11.5–15.5)
WBC: 14.1 10*3/uL — AB (ref 4.0–10.5)

## 2015-08-31 LAB — PROTIME-INR
INR: 1.08
INR: 1.1
PROTHROMBIN TIME: 14.2 s (ref 11.4–15.2)
Prothrombin Time: 14 seconds (ref 11.4–15.2)

## 2015-08-31 LAB — CREATININE, URINE, RANDOM: Creatinine, Urine: 188.01 mg/dL

## 2015-08-31 LAB — SEDIMENTATION RATE: SED RATE: 0 mm/h (ref 0–16)

## 2015-08-31 LAB — C-REACTIVE PROTEIN: CRP: 5.2 mg/dL — AB (ref <1.0)

## 2015-08-31 LAB — SODIUM, URINE, RANDOM: SODIUM UR: 22 mmol/L

## 2015-08-31 MED ORDER — SODIUM CHLORIDE 0.9 % IV BOLUS (SEPSIS)
500.0000 mL | Freq: Once | INTRAVENOUS | Status: AC
  Administered 2015-08-31: 500 mL via INTRAVENOUS

## 2015-08-31 MED ORDER — ACETAMINOPHEN 650 MG RE SUPP: 650.0000 mg | Freq: Four times a day (QID) | RECTAL | Status: DC | PRN

## 2015-08-31 MED ORDER — CHOLESTYRAMINE 4 G PO PACK
4.0000 g | PACK | Freq: Two times a day (BID) | ORAL | Status: DC
  Administered 2015-08-31 – 2015-09-01 (×3): 4 g via ORAL
  Filled 2015-08-31 (×3): qty 1

## 2015-08-31 MED ORDER — SIMVASTATIN 40 MG PO TABS
40.0000 mg | ORAL_TABLET | Freq: Every day | ORAL | Status: DC
  Administered 2015-08-31: 40 mg via ORAL
  Filled 2015-08-31: qty 1

## 2015-08-31 MED ORDER — ACETAMINOPHEN 325 MG PO TABS: 650.0000 mg | ORAL_TABLET | Freq: Four times a day (QID) | ORAL | Status: DC | PRN

## 2015-08-31 MED ORDER — ONDANSETRON HCL 4 MG PO TABS: 4.0000 mg | ORAL_TABLET | Freq: Four times a day (QID) | ORAL | Status: DC | PRN

## 2015-08-31 MED ORDER — SODIUM CHLORIDE 0.9 % IV SOLN
INTRAVENOUS | Status: DC
  Administered 2015-08-31: 20:00:00 via INTRAVENOUS

## 2015-08-31 MED ORDER — NICOTINE 21 MG/24HR TD PT24
21.0000 mg | MEDICATED_PATCH | Freq: Every day | TRANSDERMAL | Status: DC
  Administered 2015-08-31 – 2015-09-01 (×2): 21 mg via TRANSDERMAL
  Filled 2015-08-31 (×2): qty 1

## 2015-08-31 MED ORDER — METOPROLOL TARTRATE 25 MG PO TABS
25.0000 mg | ORAL_TABLET | Freq: Two times a day (BID) | ORAL | Status: DC
  Administered 2015-08-31 – 2015-09-01 (×4): 25 mg via ORAL
  Filled 2015-08-31 (×4): qty 1

## 2015-08-31 MED ORDER — CHOLESTYRAMINE 4 GM/DOSE PO POWD: 4.0000 g | Freq: Two times a day (BID) | ORAL | Status: DC

## 2015-08-31 MED ORDER — ONDANSETRON HCL 4 MG/2ML IJ SOLN
4.0000 mg | Freq: Four times a day (QID) | INTRAMUSCULAR | Status: DC | PRN
  Administered 2015-08-31: 4 mg via INTRAVENOUS
  Filled 2015-08-31: qty 2

## 2015-08-31 NOTE — Progress Notes (Signed)
PROGRESS NOTE    John Banks  WUJ:[id number] DOB: [birth year] DOA: 08/30/2015 PCP: Elige Radon Dettinger, MD  Outpatient Specialists: Dr. Harlen Labs, GI in [place], [place].   Brief Narrative:  John Banks is a 55 y.o. male with a history of chronic diarrhea, microscopic colitis and HTN who presented 8/27 with 1 day history of bloody diarrhea. He reported nearly constant diarrhea beginning the afternoon prior to arrival with blood in the stool. Also has had several episodes of emesis with bloody streaks, and has felt very weak and faint. He had evidence of AKI (Cr 1.7), a leukocytosis (WBC 24k) and polycythemia (hgb 20). FOBT was positive, CXR negative, and CT abdomen with contrast was negative. He was admitted, given IV fluids, and observed. A GI pathogen panel has been ordered and is pending, enteric precautions. He's shown significant improvement in clinical picture, improvement in leukocytosis/polycythemia. GI consultant is planning EGD today.   Assessment & Plan:   Principal Problem:   Rectal bleeding Active Problems:   Essential hypertension, benign   Microscopic colitis   AKI (acute kidney injury) (HCC)   Elevated bilirubin   Leukocytosis  Hematochezia and acute on chronic diarrhea: Infectious colitis vs. ischemic colitis or viral gastroenteritis. Diverticulitis and IBD also possible.  Suspect infectious colitis (food exposures, vomiting and diarrhea with hematochezia). Other differential includes IBD and diverticulosis.  CT abdomen negative. Remains hemodynamically stable at present.  - GI pathogen panel pending, consider abx pending results - Enteric precautions - IVF - Ondansetron PRN - GI recommendations appreciated. EGD today.   AKI: Prerenal (FENa 0.14), improving.  - Continue IVF's and monitoring BMP  Microscopic colitis: Chronic, doubt this is etiology to presentation.  - Continue home cholestyramine  HTN: Normotensive during admission - Continue home  metropolol  Leukocytosis: Acute, improved s/p IVF's and zosyn x1. - Will continue to observe and await GIPP. No evidence of sepsis.   Polycythemia: Related to tobacco use, with diminished lung sounds, presumptively due to COPD.  - Improved s/p IVF, continue IVF  Elevated bilirubin: Resolved.   DVT prophylaxis: (Lovenox/Heparin/SCD's/anticoagulated/None (if comfort care) Code Status: Full Family Communication: None at bedside, declined offer to contact them Disposition Plan: Discharge to home pending GI work up, 24 - 48 hours.   Consultants:   [facility]  Procedures:   EGD scheduled 8/28  Antimicrobials:   Received zosyn x1 8/27  Subjective: Pt reports improvement, no emesis. Some diarrhea with blood but much less. Better UOP.   Objective: Vitals:   08/30/15 2355 08/31/15 0011 08/31/15 0548 08/31/15 1031  BP:  126/76 112/72 110/66  Pulse:  95 (!) 59 80  Resp:  18 18   Temp: 97.7 F (36.5 C) 97.7 F (36.5 C) 97.7 F (36.5 C) 97.8 F (36.6 C)  TempSrc: Oral Oral Oral Oral  SpO2:  100% 94% 95%  Weight:      Height:        Intake/Output Summary (Last 24 hours) at 08/31/15 1231 Last data filed at 08/31/15 1100  Gross per 24 hour  Intake             2780 ml  Output              200 ml  Net             2580 ml   Filed Weights   08/30/15 2307  Weight: 69.4 kg (153 lb)    Examination:  General exam: Appears calm and comfortable  Respiratory system: Clear, globally diminished breath  sounds without wheezing. Respiratory effort normal. Cardiovascular system: S1 & S2 heard, RRR. No JVD, murmurs, rubs, gallops or clicks. No pedal edema. Gastrointestinal system: Abdomen is nondistended, soft and nontender. No organomegaly or masses felt. Normal bowel sounds heard. Central nervous system: Alert and oriented. No focal neurological deficits. Extremities: No deformities +PIV Skin: No rashes, lesions or ulcers Psychiatry: Pleasant, judgement and insight appear  normal. Mood & affect appropriate.   Data Reviewed: I have personally reviewed following labs and imaging studies  CBC:  Recent Labs Lab 08/30/15 1943 08/31/15 0551  WBC 24.1* 14.1*  HGB 20.2* 15.9  HCT 58.5* 46.7  MCV 87.3 86.0  PLT 228 213   Basic Metabolic Panel:  Recent Labs Lab 08/30/15 1943 08/31/15 0551  NA 139 138  K 4.3 4.3  CL 108 111  CO2 19* 20*  GLUCOSE 182* 123*  BUN 13 14  CREATININE 1.71* 1.32*  CALCIUM 9.4 8.3*   GFR: Estimated Creatinine Clearance: 62.8 mL/min (by C-G formula based on SCr of 1.32 mg/dL). Liver Function Tests:  Recent Labs Lab 08/30/15 1943 08/31/15 0551  AST 32 22  ALT 24 18  ALKPHOS 65 45  BILITOT 1.3* 0.6  PROT 7.5 5.1*  ALBUMIN 4.4 3.2*   No results for input(s): LIPASE, AMYLASE in the last 168 hours. No results for input(s): AMMONIA in the last 168 hours. Coagulation Profile:  Recent Labs Lab 08/30/15 1943 08/31/15 0551  INR 1.08 1.10   Cardiac Enzymes: No results for input(s): CKTOTAL, CKMB, CKMBINDEX, TROPONINI in the last 168 hours. BNP (last 3 results) No results for input(s): PROBNP in the last 8760 hours. HbA1C: No results for input(s): HGBA1C in the last 72 hours. CBG: No results for input(s): GLUCAP in the last 168 hours. Lipid Profile: No results for input(s): CHOL, HDL, LDLCALC, TRIG, CHOLHDL, LDLDIRECT in the last 72 hours. Thyroid Function Tests: No results for input(s): TSH, T4TOTAL, FREET4, T3FREE, THYROIDAB in the last 72 hours. Anemia Panel: No results for input(s): VITAMINB12, FOLATE, FERRITIN, TIBC, IRON, RETICCTPCT in the last 72 hours. Urine analysis:    Component Value Date/Time   COLORURINE YELLOW 08/31/2015 0311   APPEARANCEUR CLEAR 08/31/2015 0311   LABSPEC 1.020 08/31/2015 0311   PHURINE 5.0 08/31/2015 0311   GLUCOSEU NEGATIVE 08/31/2015 0311   HGBUR NEGATIVE 08/31/2015 0311   BILIRUBINUR NEGATIVE 08/31/2015 0311   KETONESUR 15 (A) 08/31/2015 0311   PROTEINUR NEGATIVE  08/31/2015 0311   NITRITE NEGATIVE 08/31/2015 0311   LEUKOCYTESUR NEGATIVE 08/31/2015 0311   Sepsis Labs: @LABRCNTIP (procalcitonin:4,lacticidven:4)  )No results found for this or any previous visit (from the past 240 hour(s)).   Radiology Studies: Ct Abdomen Pelvis W Contrast  Result Date: 08/30/2015 CLINICAL DATA:  Hematemesis, onset at 12:00.  Diarrhea. EXAM: CT ABDOMEN AND PELVIS WITH CONTRAST TECHNIQUE: Multidetector CT imaging of the abdomen and pelvis was performed using the standard protocol following bolus administration of intravenous contrast. CONTRAST:  80mL ISOVUE-300 IOPAMIDOL (ISOVUE-300) INJECTION 61% COMPARISON:  Radiographs 08/30/2015 FINDINGS: Lower chest:  No significant abnormality Hepatobiliary: There are normal appearances of the liver, gallbladder and bile ducts. Pancreas: Normal Spleen: Norm Adrenals/Urinary Tract: The adrenals and kidneys are normal in appearance. There is no urinary calculus evident. There is no hydronephrosis or ureteral dilatation. Collecting systems and ureters appear unremarkable. Stomach/Bowel: There are normal appearances of the stomach, small bowel and colon. The appendix is normal. Vascular/Lymphatic: The abdominal aorta is normal in caliber. There is mild atherosclerotic calcification. There is no adenopathy in the abdomen or  pelvis. Reproductive: Unremarkable Other: No acute inflammatory changes are evident in the abdomen or pelvis. There is no ascites. Musculoskeletal: No significant skeletal lesion. IMPRESSION: No significant abnormality. Electronically Signed   By: Ellery Plunkaniel R Mitchell M.D.   On: 08/30/2015 23:31   Dg Abdomen Acute W/chest  Result Date: 08/30/2015 CLINICAL DATA:  Nausea vomiting and diarrhea today with tingling and numbness. Syncope. EXAM: DG ABDOMEN ACUTE W/ 1V CHEST COMPARISON:  04/25/2015 FINDINGS: Lungs are hyperexpanded. The lungs are clear wiithout focal pneumonia, edema, pneumothorax or pleural effusion. Interstitial  markings are diffusely coarsened with chronic features. The cardiopericardial silhouette is within normal limits for size. Decubitus view the abdomen shows no intraperitoneal free air. Supine film shows normal bowel gas pattern. Visualized bony anatomy is unremarkable. IMPRESSION: Negative abdominal radiographs.  No acute cardiopulmonary disease. Electronically Signed   By: Kennith CenterEric  Mansell M.D.   On: 08/30/2015 21:44        Scheduled Meds: . cholestyramine  4 g Oral Q12H  . metoprolol tartrate  25 mg Oral BID  . simvastatin  40 mg Oral q1800   Continuous Infusions: . sodium chloride 125 mL/hr at 08/31/15 1041     LOS: 0 days    Time spent: 25 min  Hazeline Junkeryan Susana Duell, MD [facility] Pager [phone]  If 7PM-7AM, please contact night-coverage [email or web address] Password [email or web address] 08/31/2015, 12:31 PM

## 2015-08-31 NOTE — Consult Note (Signed)
[facility] Consult: 12:20 PM 08/31/2015  LOS: 0 days    Referring Provider: Dr Jarvis Newcomer, hospitalist  Primary Care Physician:  Elige Radon Dettinger, MD at [facility] in [facility] Primary Gastroenterologist:  In [place], Dr Harlen Labs.     Reason for Consultation:  Bloody diarrhea and Hematemesis.     HPI: John Banks is a 55 y.o. male.  Hx of COPD, still smokes 1PPD. Non-cardiac chest pain, cardiac cath in [facility] 04/2015: normal cors and LV fx.   Diarrhea last year.  S/p 07/2014 Colonoscopy that revealed microscopic colitis.  Diarrhea has responded well to BID Questran.   For 2 to 3 days, not eating or drinking as much. Urine volume has dropped.  Yesterday AM took 600mg  Ibuprofen for body aches/pains, though normally not using a lot of this.  Does at most a 6 pack of beer over a weekend.   Went on a motorcycle excursion for a few hours yesterday afternoon.  After returning home, had onset of bloody diarrhea (at least 30 episodes over several hours), weakness, non-severe abdominal pain.  Nausea and minor to moderate hematemisis once or twice.  A few episodes of syncope.  At home describes intense episodes of heartburn in past months and tums makes sxs worse but not taking PPI.  Denies dysphagia or weight loss.   In ED, Hgb of 20, WBC of 24.  Hgb today is 15.9 (baseline in 15.2 in 04/2015).  Normal platelets, MCV, coags.  BUN normal but creatinine 1.7, 1.3 this AM and normal in 04/2015.   BP to 89/67, pulse to 90s.  CT abdomen/pelvis with contrast, unremarkable.  Received single dose of Zosyn.      Works driving a dump truck.      Past Medical History:  Diagnosis Date  . Asthma   . COPD (chronic obstructive pulmonary disease) (HCC)   . Emphysema of lung (HCC)   . Hyperlipidemia     Past Surgical History:    Procedure Laterality Date  . CARDIAC CATHETERIZATION    . COLONOSCOPY W/ BIOPSIES  07/2014   dr Harlen Labs at [facility]. For diarrhea.  biopsies revealed microscopic colitis.     Prior to Admission medications   Medication Sig Start Date End Date Taking? Authorizing Provider  acetaminophen (TYLENOL) 325 MG tablet Take 325-650 mg by mouth every 6 (six) hours as needed for mild pain.   Yes Historical Provider, MD  aspirin (GOODSENSE ASPIRIN) 325 MG tablet Take 325 mg by mouth daily.   Yes Historical Provider, MD  cholestyramine (QUESTRAN) 4 GM/DOSE powder MIX 4 GRAMS( 1 SCOOP) IN LIQUID 2 TIMES A DAY WITH A MEAL 08/24/15  Yes Elige Radon Dettinger, MD  ibuprofen (ADVIL,MOTRIN) 200 MG tablet Take 200-600 mg by mouth every 6 (six) hours as needed for mild pain or moderate pain.   Yes Historical Provider, MD  metoprolol tartrate (LOPRESSOR) 25 MG tablet TAKE 1 TABLET (25 MG TOTAL) BY MOUTH 2 (TWO) TIMES DAILY. 08/24/15  Yes Elige Radon Dettinger, MD  nitroGLYCERIN (NITROSTAT) 0.4 MG SL tablet Place 0.4 mg under  the tongue every 5 (five) minutes as needed for chest pain.   Yes Historical Provider, MD  simvastatin (ZOCOR) 40 MG tablet Take 1 tablet (40 mg total) by mouth daily. 07/01/15  Yes Elige Radon Dettinger, MD  metoprolol tartrate (LOPRESSOR) 25 MG tablet Take 1 tablet (25 mg total) by mouth 2 (two) times daily. Patient not taking: Reported on 08/30/2015 07/01/15   Elige Radon Dettinger, MD    Scheduled Meds: . cholestyramine  4 g Oral Q12H  . metoprolol tartrate  25 mg Oral BID  . simvastatin  40 mg Oral q1800   Infusions: . sodium chloride 125 mL/hr at 08/31/15 1041   PRN Meds: acetaminophen **OR** acetaminophen, ondansetron **OR** ondansetron (ZOFRAN) IV   Allergies as of 08/30/2015 - Review Complete 08/30/2015  Allergen Reaction Noted  . Sulfa antibiotics Anaphylaxis, Shortness Of Breath, and Nausea And Vomiting 08/30/2015    Family History  Problem Relation Age of Onset  . Stroke Maternal  Grandfather   . Diabetes Father   . Leukemia Sister   . Autoimmune disease Neg Hx     Social History   Social History  . Marital status: Married    Spouse name: N/A  . Number of children: N/A  . Years of education: N/A   Occupational History  . Not on file.   Social History Main Topics  . Smoking status: Current Every Day Smoker    Packs/day: 2.00    Years: 40.00    Types: Cigarettes    Last attempt to quit: 04/21/2015  . Smokeless tobacco: Never Used  . Alcohol use 7.2 oz/week    12 Cans of beer per week     Comment: per week  . Drug use:     Frequency: 7.0 times per week    Types: Marijuana     Comment: daily  . Sexual activity: Yes   Other Topics Concern  . Not on file   Social History Narrative  . No narrative on file    REVIEW OF SYSTEMS: Constitutional:  Generally no weakness or functional limitations ENT:  No nose bleeds Pulm:  Stable chronic cough CV:  No palpitations, no LE edema.  GU:  No hematuria, no frequency GI:  Per HPI Heme:  No issues with excessive bleeding or bruising.     Transfusions:  None  Neuro:  No headaches, no peripheral tingling or numbness Derm:  No itching, no rash or sores.  Endocrine:  No sweats or chills.  No polyuria or dysuria Immunization:  Not questioned.  Travel:  None beyond local counties in last few months.    PHYSICAL EXAM: Vital signs in last 24 hours: Vitals:   08/31/15 0548 08/31/15 1031  BP: 112/72 110/66  Pulse: (!) 59 80  Resp: 18   Temp: 97.7 F (36.5 C) 97.8 F (36.6 C)   Wt Readings from Last 3 Encounters:  08/30/15 69.4 kg (153 lb)  04/29/15 69.8 kg (153 lb 12.8 oz)  04/25/15 71.2 kg (157 lb)    General: pleasant, looks well.   Head:  No swelling or asymmetry  Eyes:  No icterus or pallor Ears:  Not HOH  Nose:  No congestion.  Mouth:  Clear and moist and pink oral MM Neck:  No mass, no JVD.  No TMG Lungs:  Diminished bil but clear.  Raspy smokers voice.  Loose cough. Heart: RRR.  No  MRG Abdomen:  Soft, NT, ND.  No mass or HSM.  No bruits, no hernia.   Rectal:  deferred.    Musc/Skeltl: no joint swelling, redness or pain.  Extremities:  No CCE  Neurologic:  Oriented x 3.  Alert.  Moves all 4s.  No weakness or tremor.   Skin:  No rash, sores.   Tattoos:  On right upper arm, he did this himself Nodes:  No cervical or inguinal adenopathy.    Psych:  Cooperative, pleasant, calm.   Intake/Output from previous day: 08/27 0701 - 08/28 0700 In: 2220 [P.O.:220; I.V.:2000] Out: 200 [Urine:200] Intake/Output this shift: Total I/O In: 560 [P.O.:560] Out: -   LAB RESULTS:  Recent Labs  08/30/15 1943 08/31/15 0551  WBC 24.1* 14.1*  HGB 20.2* 15.9  HCT 58.5* 46.7  PLT 228 213   BMET Lab Results  Component Value Date   NA 138 08/31/2015   NA 139 08/30/2015   NA 141 04/25/2015   K 4.3 08/31/2015   K 4.3 08/30/2015   K 3.7 04/25/2015   CL 111 08/31/2015   CL 108 08/30/2015   CL 104 04/25/2015   CO2 20 (L) 08/31/2015   CO2 19 (L) 08/30/2015   CO2 24 04/25/2015   GLUCOSE 123 (H) 08/31/2015   GLUCOSE 182 (H) 08/30/2015   GLUCOSE 124 (H) 04/25/2015   BUN 14 08/31/2015   BUN 13 08/30/2015   BUN 8 04/25/2015   CREATININE 1.32 (H) 08/31/2015   CREATININE 1.71 (H) 08/30/2015   CREATININE 1.07 04/25/2015   CALCIUM 8.3 (L) 08/31/2015   CALCIUM 9.4 08/30/2015   CALCIUM 9.6 04/25/2015   LFT  Recent Labs  08/30/15 1943 08/31/15 0551  PROT 7.5 5.1*  ALBUMIN 4.4 3.2*  AST 32 22  ALT 24 18  ALKPHOS 65 45  BILITOT 1.3* 0.6   PT/INR Lab Results  Component Value Date   INR 1.10 08/31/2015   INR 1.08 08/30/2015   Hepatitis Panel No results for input(s): HEPBSAG, HCVAB, HEPAIGM, HEPBIGM in the last 72 hours. C-Diff No components found for: CDIFF Lipase     Component Value Date/Time   LIPASE 29 04/25/2015 0332    Drugs of Abuse  No results found for: LABOPIA, COCAINSCRNUR, LABBENZ, AMPHETMU, THCU, LABBARB   RADIOLOGY STUDIES: Ct Abdomen Pelvis W  Contrast  Result Date: 08/30/2015 CLINICAL DATA:  Hematemesis, onset at 12:00.  Diarrhea. EXAM: CT ABDOMEN AND PELVIS WITH CONTRAST TECHNIQUE: Multidetector CT imaging of the abdomen and pelvis was performed using the standard protocol following bolus administration of intravenous contrast. CONTRAST:  80mL ISOVUE-300 IOPAMIDOL (ISOVUE-300) INJECTION 61% COMPARISON:  Radiographs 08/30/2015 FINDINGS: Lower chest:  No significant abnormality Hepatobiliary: There are normal appearances of the liver, gallbladder and bile ducts. Pancreas: Normal Spleen: Norm Adrenals/Urinary Tract: The adrenals and kidneys are normal in appearance. There is no urinary calculus evident. There is no hydronephrosis or ureteral dilatation. Collecting systems and ureters appear unremarkable. Stomach/Bowel: There are normal appearances of the stomach, small bowel and colon. The appendix is normal. Vascular/Lymphatic: The abdominal aorta is normal in caliber. There is mild atherosclerotic calcification. There is no adenopathy in the abdomen or pelvis. Reproductive: Unremarkable Other: No acute inflammatory changes are evident in the abdomen or pelvis. There is no ascites. Musculoskeletal: No significant skeletal lesion. IMPRESSION: No significant abnormality. Electronically Signed   By: Ellery Plunk M.D.   On: 08/30/2015 23:31   Dg Abdomen Acute W/chest  Result Date: 08/30/2015 CLINICAL DATA:  Nausea vomiting and diarrhea today with tingling and numbness. Syncope. EXAM: DG ABDOMEN ACUTE W/ 1V CHEST COMPARISON:  04/25/2015 FINDINGS: Lungs are hyperexpanded. The  lungs are clear wiithout focal pneumonia, edema, pneumothorax or pleural effusion. Interstitial markings are diffusely coarsened with chronic features. The cardiopericardial silhouette is within normal limits for size. Decubitus view the abdomen shows no intraperitoneal free air. Supine film shows normal bowel gas pattern. Visualized bony anatomy is unremarkable. IMPRESSION:  Negative abdominal radiographs.  No acute cardiopulmonary disease. Electronically Signed   By: Kennith CenterEric  Mansell M.D.   On: 08/30/2015 21:44    ENDOSCOPIC STUDIES: Per HPI  IMPRESSION:   *  Bloody diarrhea and limited hematemesis.   Though CT was unremarkable, the diarrhea sounds like it could be ischemic colitis as he was dehydrated on arrival.  Microscopic colitis, discovered on 07/2014 colonoscopy, diarrhea is well controlled with questran and [facility] does not present acutely or with bloody diarrhea.  With the hematemesis, there may be an ulcer or malignancy present and has hx of pyrosis in recent months.   *  COPD, emphysema  PLAN:     *  EGD this afternoon at 4 PM.  Pt NPO til then, tolerated clears this AM.   After EGD, can determine if PPI is needed.      Jennye MoccasinSarah Shalina Norfolk  08/31/2015, 12:20 PM Pager: [phone]

## 2015-09-01 ENCOUNTER — Encounter (HOSPITAL_COMMUNITY): Payer: Self-pay

## 2015-09-01 ENCOUNTER — Encounter (HOSPITAL_COMMUNITY)
Admission: EM | Disposition: A | Discharge status: Home / Self Care | Payer: Self-pay | Source: Home / Self Care | Attending: Family Medicine

## 2015-09-01 ENCOUNTER — Inpatient Hospital Stay (HOSPITAL_COMMUNITY): Payer: Self-pay | Admitting: Anesthesiology

## 2015-09-01 DIAGNOSIS — R110 Nausea: Secondary | ICD-10-CM

## 2015-09-01 DIAGNOSIS — R1110 Vomiting, unspecified: Secondary | ICD-10-CM

## 2015-09-01 DIAGNOSIS — K920 Hematemesis: Secondary | ICD-10-CM

## 2015-09-01 HISTORY — PX: ESOPHAGOGASTRODUODENOSCOPY: SHX5428

## 2015-09-01 LAB — BASIC METABOLIC PANEL
ANION GAP: 5 (ref 5–15)
BUN: 8 mg/dL (ref 6–20)
CO2: 22 mmol/L (ref 22–32)
Calcium: 8.7 mg/dL — ABNORMAL LOW (ref 8.9–10.3)
Chloride: 112 mmol/L — ABNORMAL HIGH (ref 101–111)
Creatinine, Ser: 0.99 mg/dL (ref 0.61–1.24)
GFR calc Af Amer: >60 mL/min (ref >60)
GFR calc non Af Amer: >60 mL/min (ref >60)
GLUCOSE: 93 mg/dL (ref 65–99)
POTASSIUM: 3.8 mmol/L (ref 3.5–5.1)
Sodium: 139 mmol/L (ref 135–145)

## 2015-09-01 LAB — CBC
HCT: 44.9 % (ref 39.0–52.0)
HEMOGLOBIN: 15.2 g/dL (ref 13.0–17.0)
MCH: 29.3 pg (ref 26.0–34.0)
MCHC: 33.9 g/dL (ref 30.0–36.0)
MCV: 86.7 fL (ref 78.0–100.0)
Platelets: 175 10*3/uL (ref 150–400)
RBC: 5.18 MIL/uL (ref 4.22–5.81)
RDW: 13.4 % (ref 11.5–15.5)
WBC: 9.7 10*3/uL (ref 4.0–10.5)

## 2015-09-01 SURGERY — EGD (ESOPHAGOGASTRODUODENOSCOPY)
Anesthesia: Monitor Anesthesia Care

## 2015-09-01 MED ORDER — LACTATED RINGERS IV SOLN
INTRAVENOUS | Status: DC
  Administered 2015-09-01: 1000 mL via INTRAVENOUS

## 2015-09-01 MED ORDER — FENTANYL CITRATE (PF) 250 MCG/5ML IJ SOLN
INTRAMUSCULAR | Status: DC | PRN
  Administered 2015-09-01: 100 ug via INTRAVENOUS

## 2015-09-01 MED ORDER — PANTOPRAZOLE SODIUM 20 MG PO TBEC: 20.0000 mg | DELAYED_RELEASE_TABLET | Freq: Every day | ORAL | 0 refills | Status: DC

## 2015-09-01 MED ORDER — PROPOFOL 10 MG/ML IV BOLUS
INTRAVENOUS | Status: DC | PRN
  Administered 2015-09-01 (×2): 30 mg via INTRAVENOUS
  Administered 2015-09-01: 40 mg via INTRAVENOUS
  Administered 2015-09-01: 30 mg via INTRAVENOUS

## 2015-09-01 MED ORDER — PANTOPRAZOLE SODIUM 20 MG PO TBEC
20.0000 mg | DELAYED_RELEASE_TABLET | Freq: Every day | ORAL | Status: DC
Start: 2015-09-01 — End: 2015-09-01
  Administered 2015-09-01: 20 mg via ORAL
  Filled 2015-09-01 (×2): qty 1

## 2015-09-01 MED ORDER — MIDAZOLAM HCL 5 MG/5ML IJ SOLN
INTRAMUSCULAR | Status: DC | PRN
  Administered 2015-09-01: 2 mg via INTRAVENOUS

## 2015-09-01 MED ORDER — LIDOCAINE HCL (CARDIAC) 20 MG/ML IV SOLN
INTRAVENOUS | Status: DC | PRN
  Administered 2015-09-01: 100 mg via INTRATRACHEAL

## 2015-09-01 NOTE — Op Note (Signed)
[facility] Patient Name: John Banks Procedure Date : 09/01/2015 MRN: [id number] Attending MD: Napoleon Form , MD Date of Birth: [birth year] CSN: [id number] Age: 55 Admit Type: Inpatient Procedure:                Upper GI endoscopy Indications:              Recent gastrointestinal bleeding, hematemsis,                            nausea and vomiting Providers:                Napoleon Form, MD, Dwain Sarna, RN,                            Kandice Robinsons, Technician Referring MD:              Medicines:                Monitored Anesthesia Care Complications:            No immediate complications. Estimated Blood Loss:     Estimated blood loss: none. Procedure:                Pre-Anesthesia Assessment:                           - Prior to the procedure, a History and Physical                            was performed, and patient medications and                            allergies were reviewed. The patient's tolerance of                            previous anesthesia was also reviewed. The risks                            and benefits of the procedure and the sedation                            options and risks were discussed with the patient.                            All questions were answered, and informed consent                            was obtained. Prior Anticoagulants: The patient has                            taken no previous anticoagulant or antiplatelet                            agents. ASA Grade Assessment: II - A patient with  mild systemic disease. After reviewing the risks                            and benefits, the patient was deemed in                            satisfactory condition to undergo the procedure.                           After obtaining informed consent, the endoscope was                            passed under direct vision. Throughout the                            procedure, the patient's  blood pressure, pulse, and                            oxygen saturations were monitored continuously. The                            EG-[id number] ([id number]) scope was introduced through the                            mouth, and advanced to the second part of duodenum.                            The upper GI endoscopy was accomplished without                            difficulty. The patient tolerated the procedure                            well. Scope In: Scope Out: Findings:      A small non-bleeding linear mucosal Mallory-Weiss tear <103mm with       stigmata of recent bleeding was found.      The stomach was normal.      The examined duodenum was normal. Impression:               - Mallory-Weiss tear likely etiology of hematemesis.                           - Normal stomach.                           - Normal examined duodenum.                           - No specimens collected. Moderate Sedation:      N/A Recommendation:           - Resume previous diet.                           - Continue present medications.                           -  Pantoprazole 20 mg daily, 30 mins before                            breakfast for 2 weeks and subsequesntly as needed                           - No ibuprofen, naproxen, or other non-steroidal                            anti-inflammatory drugs for 2 weeks.                           -Ok to discharge home from GI perspective                           -Will sign off, available for any questions Procedure Code(s):        --- Professional ---                           [phone], Esophagogastroduodenoscopy, flexible,                            transoral; diagnostic, including collection of                            specimen(s) by brushing or washing, when performed                            (separate procedure) Diagnosis Code(s):        --- Professional ---                           K22.6, Gastro-esophageal laceration-hemorrhage                             syndrome                           K92.2, Gastrointestinal hemorrhage, unspecified CPT copyright 2016 [employer]. All rights reserved. The codes documented in this report are preliminary and upon coder review may  be revised to meet current compliance requirements. Napoleon FormKavitha V. Nandigam, MD 09/01/2015 10:30:26 AM This report has been signed electronically. Number of Addenda: 0

## 2015-09-01 NOTE — Transfer of Care (Signed)
Immediate Anesthesia Transfer of Care Note  Patient: Elita QuickRonald Iovino  Procedure(s) Performed: Procedure(s): ESOPHAGOGASTRODUODENOSCOPY (EGD) (N/A)  Patient Location: Endoscopy Unit  Anesthesia Type:MAC  Level of Consciousness: awake, alert  and oriented  Airway & Oxygen Therapy: Patient Spontanous Breathing  Post-op Assessment: Report given to RN, Post -op Vital signs reviewed and stable and Patient moving all extremities X 4  Post vital signs: Reviewed and stable  Last Vitals:  Vitals:   09/01/15 0922 09/01/15 1027  BP: 126/81 (!) 86/57  Pulse: 65 63  Resp: 12 18  Temp: 36.6 C     Last Pain:  Vitals:   09/01/15 1027  TempSrc: Oral         Complications: No apparent anesthesia complications

## 2015-09-01 NOTE — H&P (View-Only) (Signed)
[facility] Consult: 12:20 PM 08/31/2015  LOS: 0 days    Referring Provider: Dr Jarvis Newcomer, hospitalist  Primary Care Physician:  Elige Radon Dettinger, MD at [facility] in [facility] Primary Gastroenterologist:  In [place], Dr Harlen Labs.     Reason for Consultation:  Bloody diarrhea and Hematemesis.     HPI: John Banks is a 55 y.o. male.  Hx of COPD, still smokes 1PPD. Non-cardiac chest pain, cardiac cath in [facility] 04/2015: normal cors and LV fx.   Diarrhea last year.  S/p 07/2014 Colonoscopy that revealed microscopic colitis.  Diarrhea has responded well to BID Questran.   For 2 to 3 days, not eating or drinking as much. Urine volume has dropped.  Yesterday AM took 600mg  Ibuprofen for body aches/pains, though normally not using a lot of this.  Does at most a 6 pack of beer over a weekend.   Went on a motorcycle excursion for a few hours yesterday afternoon.  After returning home, had onset of bloody diarrhea (at least 30 episodes over several hours), weakness, non-severe abdominal pain.  Nausea and minor to moderate hematemisis once or twice.  A few episodes of syncope.  At home describes intense episodes of heartburn in past months and tums makes sxs worse but not taking PPI.  Denies dysphagia or weight loss.   In ED, Hgb of 20, WBC of 24.  Hgb today is 15.9 (baseline in 15.2 in 04/2015).  Normal platelets, MCV, coags.  BUN normal but creatinine 1.7, 1.3 this AM and normal in 04/2015.   BP to 89/67, pulse to 90s.  CT abdomen/pelvis with contrast, unremarkable.  Received single dose of Zosyn.      Works driving a dump truck.      Past Medical History:  Diagnosis Date  . Asthma   . COPD (chronic obstructive pulmonary disease) (HCC)   . Emphysema of lung (HCC)   . Hyperlipidemia     Past Surgical History:    Procedure Laterality Date  . CARDIAC CATHETERIZATION    . COLONOSCOPY W/ BIOPSIES  07/2014   dr Harlen Labs at [facility]. For diarrhea.  biopsies revealed microscopic colitis.     Prior to Admission medications   Medication Sig Start Date End Date Taking? Authorizing Provider  acetaminophen (TYLENOL) 325 MG tablet Take 325-650 mg by mouth every 6 (six) hours as needed for mild pain.   Yes Historical Provider, MD  aspirin (GOODSENSE ASPIRIN) 325 MG tablet Take 325 mg by mouth daily.   Yes Historical Provider, MD  cholestyramine (QUESTRAN) 4 GM/DOSE powder MIX 4 GRAMS( 1 SCOOP) IN LIQUID 2 TIMES A DAY WITH A MEAL 08/24/15  Yes Elige Radon Dettinger, MD  ibuprofen (ADVIL,MOTRIN) 200 MG tablet Take 200-600 mg by mouth every 6 (six) hours as needed for mild pain or moderate pain.   Yes Historical Provider, MD  metoprolol tartrate (LOPRESSOR) 25 MG tablet TAKE 1 TABLET (25 MG TOTAL) BY MOUTH 2 (TWO) TIMES DAILY. 08/24/15  Yes Elige Radon Dettinger, MD  nitroGLYCERIN (NITROSTAT) 0.4 MG SL tablet Place 0.4 mg under  the tongue every 5 (five) minutes as needed for chest pain.   Yes Historical Provider, MD  simvastatin (ZOCOR) 40 MG tablet Take 1 tablet (40 mg total) by mouth daily. 07/01/15  Yes Elige Radon Dettinger, MD  metoprolol tartrate (LOPRESSOR) 25 MG tablet Take 1 tablet (25 mg total) by mouth 2 (two) times daily. Patient not taking: Reported on 08/30/2015 07/01/15   Elige Radon Dettinger, MD    Scheduled Meds: . cholestyramine  4 g Oral Q12H  . metoprolol tartrate  25 mg Oral BID  . simvastatin  40 mg Oral q1800   Infusions: . sodium chloride 125 mL/hr at 08/31/15 1041   PRN Meds: acetaminophen **OR** acetaminophen, ondansetron **OR** ondansetron (ZOFRAN) IV   Allergies as of 08/30/2015 - Review Complete 08/30/2015  Allergen Reaction Noted  . Sulfa antibiotics Anaphylaxis, Shortness Of Breath, and Nausea And Vomiting 08/30/2015    Family History  Problem Relation Age of Onset  . Stroke Maternal  Grandfather   . Diabetes Father   . Leukemia Sister   . Autoimmune disease Neg Hx     Social History   Social History  . Marital status: Married    Spouse name: N/A  . Number of children: N/A  . Years of education: N/A   Occupational History  . Not on file.   Social History Main Topics  . Smoking status: Current Every Day Smoker    Packs/day: 2.00    Years: 40.00    Types: Cigarettes    Last attempt to quit: 04/21/2015  . Smokeless tobacco: Never Used  . Alcohol use 7.2 oz/week    12 Cans of beer per week     Comment: per week  . Drug use:     Frequency: 7.0 times per week    Types: Marijuana     Comment: daily  . Sexual activity: Yes   Other Topics Concern  . Not on file   Social History Narrative  . No narrative on file    REVIEW OF SYSTEMS: Constitutional:  Generally no weakness or functional limitations ENT:  No nose bleeds Pulm:  Stable chronic cough CV:  No palpitations, no LE edema.  GU:  No hematuria, no frequency GI:  Per HPI Heme:  No issues with excessive bleeding or bruising.     Transfusions:  None  Neuro:  No headaches, no peripheral tingling or numbness Derm:  No itching, no rash or sores.  Endocrine:  No sweats or chills.  No polyuria or dysuria Immunization:  Not questioned.  Travel:  None beyond local counties in last few months.    PHYSICAL EXAM: Vital signs in last 24 hours: Vitals:   08/31/15 0548 08/31/15 1031  BP: 112/72 110/66  Pulse: (!) 59 80  Resp: 18   Temp: 97.7 F (36.5 C) 97.8 F (36.6 C)   Wt Readings from Last 3 Encounters:  08/30/15 69.4 kg (153 lb)  04/29/15 69.8 kg (153 lb 12.8 oz)  04/25/15 71.2 kg (157 lb)    General: pleasant, looks well.   Head:  No swelling or asymmetry  Eyes:  No icterus or pallor Ears:  Not HOH  Nose:  No congestion.  Mouth:  Clear and moist and pink oral MM Neck:  No mass, no JVD.  No TMG Lungs:  Diminished bil but clear.  Raspy smokers voice.  Loose cough. Heart: RRR.  No  MRG Abdomen:  Soft, NT, ND.  No mass or HSM.  No bruits, no hernia.   Rectal:  deferred.    Musc/Skeltl: no joint swelling, redness or pain.  Extremities:  No CCE  Neurologic:  Oriented x 3.  Alert.  Moves all 4s.  No weakness or tremor.   Skin:  No rash, sores.   Tattoos:  On right upper arm, he did this himself Nodes:  No cervical or inguinal adenopathy.    Psych:  Cooperative, pleasant, calm.   Intake/Output from previous day: 08/27 0701 - 08/28 0700 In: 2220 [P.O.:220; I.V.:2000] Out: 200 [Urine:200] Intake/Output this shift: Total I/O In: 560 [P.O.:560] Out: -   LAB RESULTS:  Recent Labs  08/30/15 1943 08/31/15 0551  WBC 24.1* 14.1*  HGB 20.2* 15.9  HCT 58.5* 46.7  PLT 228 213   BMET Lab Results  Component Value Date   NA 138 08/31/2015   NA 139 08/30/2015   NA 141 04/25/2015   K 4.3 08/31/2015   K 4.3 08/30/2015   K 3.7 04/25/2015   CL 111 08/31/2015   CL 108 08/30/2015   CL 104 04/25/2015   CO2 20 (L) 08/31/2015   CO2 19 (L) 08/30/2015   CO2 24 04/25/2015   GLUCOSE 123 (H) 08/31/2015   GLUCOSE 182 (H) 08/30/2015   GLUCOSE 124 (H) 04/25/2015   BUN 14 08/31/2015   BUN 13 08/30/2015   BUN 8 04/25/2015   CREATININE 1.32 (H) 08/31/2015   CREATININE 1.71 (H) 08/30/2015   CREATININE 1.07 04/25/2015   CALCIUM 8.3 (L) 08/31/2015   CALCIUM 9.4 08/30/2015   CALCIUM 9.6 04/25/2015   LFT  Recent Labs  08/30/15 1943 08/31/15 0551  PROT 7.5 5.1*  ALBUMIN 4.4 3.2*  AST 32 22  ALT 24 18  ALKPHOS 65 45  BILITOT 1.3* 0.6   PT/INR Lab Results  Component Value Date   INR 1.10 08/31/2015   INR 1.08 08/30/2015   Hepatitis Panel No results for input(s): HEPBSAG, HCVAB, HEPAIGM, HEPBIGM in the last 72 hours. C-Diff No components found for: CDIFF Lipase     Component Value Date/Time   LIPASE 29 04/25/2015 0332    Drugs of Abuse  No results found for: LABOPIA, COCAINSCRNUR, LABBENZ, AMPHETMU, THCU, LABBARB   RADIOLOGY STUDIES: Ct Abdomen Pelvis W  Contrast  Result Date: 08/30/2015 CLINICAL DATA:  Hematemesis, onset at 12:00.  Diarrhea. EXAM: CT ABDOMEN AND PELVIS WITH CONTRAST TECHNIQUE: Multidetector CT imaging of the abdomen and pelvis was performed using the standard protocol following bolus administration of intravenous contrast. CONTRAST:  80mL ISOVUE-300 IOPAMIDOL (ISOVUE-300) INJECTION 61% COMPARISON:  Radiographs 08/30/2015 FINDINGS: Lower chest:  No significant abnormality Hepatobiliary: There are normal appearances of the liver, gallbladder and bile ducts. Pancreas: Normal Spleen: Norm Adrenals/Urinary Tract: The adrenals and kidneys are normal in appearance. There is no urinary calculus evident. There is no hydronephrosis or ureteral dilatation. Collecting systems and ureters appear unremarkable. Stomach/Bowel: There are normal appearances of the stomach, small bowel and colon. The appendix is normal. Vascular/Lymphatic: The abdominal aorta is normal in caliber. There is mild atherosclerotic calcification. There is no adenopathy in the abdomen or pelvis. Reproductive: Unremarkable Other: No acute inflammatory changes are evident in the abdomen or pelvis. There is no ascites. Musculoskeletal: No significant skeletal lesion. IMPRESSION: No significant abnormality. Electronically Signed   By: Ellery Plunk M.D.   On: 08/30/2015 23:31   Dg Abdomen Acute W/chest  Result Date: 08/30/2015 CLINICAL DATA:  Nausea vomiting and diarrhea today with tingling and numbness. Syncope. EXAM: DG ABDOMEN ACUTE W/ 1V CHEST COMPARISON:  04/25/2015 FINDINGS: Lungs are hyperexpanded. The  lungs are clear wiithout focal pneumonia, edema, pneumothorax or pleural effusion. Interstitial markings are diffusely coarsened with chronic features. The cardiopericardial silhouette is within normal limits for size. Decubitus view the abdomen shows no intraperitoneal free air. Supine film shows normal bowel gas pattern. Visualized bony anatomy is unremarkable. IMPRESSION:  Negative abdominal radiographs.  No acute cardiopulmonary disease. Electronically Signed   By: Kennith CenterEric  Mansell M.D.   On: 08/30/2015 21:44    ENDOSCOPIC STUDIES: Per HPI  IMPRESSION:   *  Bloody diarrhea and limited hematemesis.   Though CT was unremarkable, the diarrhea sounds like it could be ischemic colitis as he was dehydrated on arrival.  Microscopic colitis, discovered on 07/2014 colonoscopy, diarrhea is well controlled with questran and [facility] does not present acutely or with bloody diarrhea.  With the hematemesis, there may be an ulcer or malignancy present and has hx of pyrosis in recent months.   *  COPD, emphysema  PLAN:     *  EGD this afternoon at 4 PM.  Pt NPO til then, tolerated clears this AM.   After EGD, can determine if PPI is needed.      Jennye MoccasinSarah Jimmey Hengel  08/31/2015, 12:20 PM Pager: [phone]

## 2015-09-01 NOTE — Progress Notes (Signed)
Patient discharged home with wife at 1600. Discharge instructions were given and explained by nurse. Pt and wife verbalized understanding and no further questions at that time.  Rivkah Wolz, Phill MutterMelissa Rebecca

## 2015-09-01 NOTE — Discharge Summary (Signed)
Physician Discharge Summary  John Banks ZOX:[id number] DOB: 03-24-1960 DOA: 08/30/2015  PCP: John Radon Dettinger, MD  Admit date: 08/30/2015 Discharge date: 09/01/2015  Admitted From: Home Disposition: Home   Recommendations for Outpatient Follow-up:  1. Follow up with PCP in 1-2 weeks 2. Please obtain BMP/CBC in one week 3. Please continue smoking cessation counseling. Pt expressed interest in chantix.   Home Health: None Equipment/Devices: None  Discharge Condition: Stable CODE STATUS: Full Diet recommendation: Regular  Brief/Interim Summary: John Banks a 55 y.o.malewith a history of chronic diarrhea, microscopic colitis and HTNwho presented 8/27 with a 1 day history of bloody diarrhea and hematemesis. He reported nearly constant diarrhea beginning the afternoon prior to arrival with blood in the stool. Also has had several episodes of emesis with bloody streaks, and has felt very weak and faint. He had evidence of AKI (Cr 1.7), a leukocytosis (WBC 24k) and polycythemia (hgb 20). FOBT was positive, CXR negative, and CT abdomen with contrast was negative. He was admitted, given IV fluids, and observed. A GI pathogen panel was negative and EGD showed Mallory-Weiss tear. He's shown significant improvement in clinical picture, resolution of leukocytosis, and no evidence of infection. GI cleared him for discharge and recommended PPI x 2 weeks and follow up with PCP and GI.   Discharge Diagnoses:  Principal Problem:   Rectal bleeding Active Problems:   Essential hypertension, benign   Microscopic colitis   AKI (acute kidney injury) (HCC)   Elevated bilirubin   Leukocytosis   GI bleed   Hematemesis with nausea   Uncontrollable vomiting   Discharge Instructions Discharge Instructions    Diet - low sodium heart healthy    Complete by:  As directed   Discharge instructions    Complete by:  As directed   You were admitted with dehydration that has improved with IV fluids.  Your symptoms have improved and as long as you are able to maintain your hydration without further vomiting you are clear to be discharged. The cause of bloody streaks in the vomit you had is from a small tear in your esophagus called a Mallory-Weiss tear. This will get better on its own, and should be treated with a medication that suppresses the acid in your stomach, called pantoprazole 20 mg daily, 30 mins beforebreakfast for 2 weeks and subsequesntly as needed. No ibuprofen, naproxen, or other non-steroidal anti-inflammatory drugs (ibuprofen, motrin, advil) for example for 2 weeks.  - Follow up with you primary doctor to discuss smoking cessation - If you have worsening symptoms or inability to tolerate fluids again, you must return for evaluation.       Medication List    STOP taking these medications   GOODSENSE ASPIRIN 325 MG tablet Generic drug:  aspirin   ibuprofen 200 MG tablet Commonly known as:  ADVIL,MOTRIN     TAKE these medications   acetaminophen 325 MG tablet Commonly known as:  TYLENOL Take 325-650 mg by mouth every 6 (six) hours as needed for mild pain.   cholestyramine 4 GM/DOSE powder Commonly known as:  QUESTRAN MIX 4 GRAMS( 1 SCOOP) IN LIQUID 2 TIMES A DAY WITH A MEAL   metoprolol tartrate 25 MG tablet Commonly known as:  LOPRESSOR Take 1 tablet (25 mg total) by mouth 2 (two) times daily. What changed:  Another medication with the same name was removed. Continue taking this medication, and follow the directions you see here.   nitroGLYCERIN 0.4 MG SL tablet Commonly known as:  NITROSTAT Place 0.4  mg under the tongue every 5 (five) minutes as needed for chest pain.   pantoprazole 20 MG tablet Commonly known as:  PROTONIX Take 1 tablet (20 mg total) by mouth daily.   simvastatin 40 MG tablet Commonly known as:  ZOCOR Take 1 tablet (40 mg total) by mouth daily.      Follow-up Information    John Radon Dettinger, MD Follow up in 1 week(s).    Specialties:  Family Medicine, Cardiology Contact information: [address] [place] [address] [phone]          Allergies  Allergen Reactions  . Sulfa Antibiotics Anaphylaxis, Shortness Of Breath and Nausea And Vomiting    Patient stated that he was also "paralyzed" and had to be hospitalized    Consultations:  [facility], Dr. Lavon Banks  Procedures/Studies: Ct Abdomen Pelvis W Contrast  Result Date: 08/30/2015 CLINICAL DATA:  Hematemesis, onset at 12:00.  Diarrhea. EXAM: CT ABDOMEN AND PELVIS WITH CONTRAST TECHNIQUE: Multidetector CT imaging of the abdomen and pelvis was performed using the standard protocol following bolus administration of intravenous contrast. CONTRAST:  80mL ISOVUE-300 IOPAMIDOL (ISOVUE-300) INJECTION 61% COMPARISON:  Radiographs 08/30/2015 FINDINGS: Lower chest:  No significant abnormality Hepatobiliary: There are normal appearances of the liver, gallbladder and bile ducts. Pancreas: Normal Spleen: Norm Adrenals/Urinary Tract: The adrenals and kidneys are normal in appearance. There is no urinary calculus evident. There is no hydronephrosis or ureteral dilatation. Collecting systems and ureters appear unremarkable. Stomach/Bowel: There are normal appearances of the stomach, small bowel and colon. The appendix is normal. Vascular/Lymphatic: The abdominal aorta is normal in caliber. There is mild atherosclerotic calcification. There is no adenopathy in the abdomen or pelvis. Reproductive: Unremarkable Other: No acute inflammatory changes are evident in the abdomen or pelvis. There is no ascites. Musculoskeletal: No significant skeletal lesion. IMPRESSION: No significant abnormality. Electronically Signed   By: Ellery Plunk M.D.   On: 08/30/2015 23:31   Dg Abdomen Acute W/chest  Result Date: 08/30/2015 CLINICAL DATA:  Nausea vomiting and diarrhea today with tingling and numbness. Syncope. EXAM: DG ABDOMEN ACUTE W/ 1V CHEST COMPARISON:  04/25/2015 FINDINGS:  Lungs are hyperexpanded. The lungs are clear wiithout focal pneumonia, edema, pneumothorax or pleural effusion. Interstitial markings are diffusely coarsened with chronic features. The cardiopericardial silhouette is within normal limits for size. Decubitus view the abdomen shows no intraperitoneal free air. Supine film shows normal bowel gas pattern. Visualized bony anatomy is unremarkable. IMPRESSION: Negative abdominal radiographs.  No acute cardiopulmonary disease. Electronically Signed   By: Kennith Center M.D.   On: 08/30/2015 21:44   EGD 09/01/2015: Dr. Lavon Banks Impression:       - Mallory-Weiss tear likely etiology of hematemesis.                           - Normal stomach.                           - Normal examined duodenum.                           - No specimens collected. Rec:                - Resume previous diet.                           - Continue present medications.                           -  Pantoprazole 20 mg daily, 30 mins before                            breakfast for 2 weeks and subsequesntly as needed                           - No ibuprofen, naproxen, or other non-steroidal                            anti-inflammatory drugs for 2 weeks.                           -Ok to discharge home from GI perspective                           -Will sign off, available for any questions  Subjective: Feels much better, eating lunch without difficulty. No diarrhea, BRBPR, or emesis today.  Discharge Exam: Vitals:   09/01/15 1114 09/01/15 1506  BP: 104/60 110/72  Pulse: 60 (!) 57  Resp: 18 18  Temp: 97.7 F (36.5 C) 97.8 F (36.6 C)   Vitals:   09/01/15 1035 09/01/15 1045 09/01/15 1114 09/01/15 1506  BP: (!) 101/59 102/73 104/60 110/72  Pulse: 61 (!) 57 60 (!) 57  Resp: 17 16 18 18   Temp:   97.7 F (36.5 C) 97.8 F (36.6 C)  TempSrc:   Oral   SpO2: 96% 95% 97% 96%  Weight:      Height:       General: Pt is alert, awake, not in acute distress Cardiovascular: RRR,  S1/S2 +, no rubs, no gallops Respiratory: Decreased air movement, no wheezing or crackles.  Abdominal: Soft, NT, ND, bowel sounds + Extremities: no edema, no cyanosis  The results of significant diagnostics from this hospitalization (including imaging, microbiology, ancillary and laboratory) are listed below for reference.    Microbiology: Recent Results (from the past 240 hour(s))  Gastrointestinal Panel by PCR , Stool     Status: None   Collection Time: 08/31/15  9:18 AM  Result Value Ref Range Status   Campylobacter species NOT DETECTED NOT DETECTED Final   Plesimonas shigelloides NOT DETECTED NOT DETECTED Final   Salmonella species NOT DETECTED NOT DETECTED Final   Yersinia enterocolitica NOT DETECTED NOT DETECTED Final   Vibrio species NOT DETECTED NOT DETECTED Final   Vibrio cholerae NOT DETECTED NOT DETECTED Final   Enteroaggregative E coli (EAEC) NOT DETECTED NOT DETECTED Final   Enteropathogenic E coli (EPEC) NOT DETECTED NOT DETECTED Final   Enterotoxigenic E coli (ETEC) NOT DETECTED NOT DETECTED Final   Shiga like toxin producing E coli (STEC) NOT DETECTED NOT DETECTED Final   Shigella/Enteroinvasive E coli (EIEC) NOT DETECTED NOT DETECTED Final   Cryptosporidium NOT DETECTED NOT DETECTED Final   Cyclospora cayetanensis NOT DETECTED NOT DETECTED Final   Entamoeba histolytica NOT DETECTED NOT DETECTED Final   Giardia lamblia NOT DETECTED NOT DETECTED Final   Adenovirus F40/41 NOT DETECTED NOT DETECTED Final   Astrovirus NOT DETECTED NOT DETECTED Final   Norovirus GI/GII NOT DETECTED NOT DETECTED Final   Rotavirus A NOT DETECTED NOT DETECTED Final   Sapovirus (I, II, IV, and V) NOT DETECTED NOT DETECTED Final     Labs: BNP (last 3 results) No results for input(s): BNP in  the last 8760 hours. Basic Metabolic Panel:  Recent Labs Lab 08/30/15 1943 08/31/15 0551 09/01/15 0801  NA 139 138 139  K 4.3 4.3 3.8  CL 108 111 112*  CO2 19* 20* 22  GLUCOSE 182* 123* 93   BUN 13 14 8   CREATININE 1.71* 1.32* 0.99  CALCIUM 9.4 8.3* 8.7*   Liver Function Tests:  Recent Labs Lab 08/30/15 1943 08/31/15 0551  AST 32 22  ALT 24 18  ALKPHOS 65 45  BILITOT 1.3* 0.6  PROT 7.5 5.1*  ALBUMIN 4.4 3.2*   No results for input(s): LIPASE, AMYLASE in the last 168 hours. No results for input(s): AMMONIA in the last 168 hours. CBC:  Recent Labs Lab 08/30/15 1943 08/31/15 0551 09/01/15 0801  WBC 24.1* 14.1* 9.7  HGB 20.2* 15.9 15.2  HCT 58.5* 46.7 44.9  MCV 87.3 86.0 86.7  PLT 228 213 175   Cardiac Enzymes: No results for input(s): CKTOTAL, CKMB, CKMBINDEX, TROPONINI in the last 168 hours. BNP: Invalid input(s): POCBNP CBG: No results for input(s): GLUCAP in the last 168 hours. D-Dimer No results for input(s): DDIMER in the last 72 hours. Hgb A1c No results for input(s): HGBA1C in the last 72 hours. Lipid Profile No results for input(s): CHOL, HDL, LDLCALC, TRIG, CHOLHDL, LDLDIRECT in the last 72 hours. Thyroid function studies No results for input(s): TSH, T4TOTAL, T3FREE, THYROIDAB in the last 72 hours.  Invalid input(s): FREET3 Anemia work up No results for input(s): VITAMINB12, FOLATE, FERRITIN, TIBC, IRON, RETICCTPCT in the last 72 hours. Urinalysis    Component Value Date/Time   COLORURINE YELLOW 08/31/2015 0311   APPEARANCEUR CLEAR 08/31/2015 0311   LABSPEC 1.020 08/31/2015 0311   PHURINE 5.0 08/31/2015 0311   GLUCOSEU NEGATIVE 08/31/2015 0311   HGBUR NEGATIVE 08/31/2015 0311   BILIRUBINUR NEGATIVE 08/31/2015 0311   KETONESUR 15 (A) 08/31/2015 0311   PROTEINUR NEGATIVE 08/31/2015 0311   NITRITE NEGATIVE 08/31/2015 0311   LEUKOCYTESUR NEGATIVE 08/31/2015 0311   Sepsis Labs Invalid input(s): PROCALCITONIN,  WBC,  LACTICIDVEN Microbiology Recent Results (from the past 240 hour(s))  Gastrointestinal Panel by PCR , Stool     Status: None   Collection Time: 08/31/15  9:18 AM  Result Value Ref Range Status   Campylobacter species  NOT DETECTED NOT DETECTED Final   Plesimonas shigelloides NOT DETECTED NOT DETECTED Final   Salmonella species NOT DETECTED NOT DETECTED Final   Yersinia enterocolitica NOT DETECTED NOT DETECTED Final   Vibrio species NOT DETECTED NOT DETECTED Final   Vibrio cholerae NOT DETECTED NOT DETECTED Final   Enteroaggregative E coli (EAEC) NOT DETECTED NOT DETECTED Final   Enteropathogenic E coli (EPEC) NOT DETECTED NOT DETECTED Final   Enterotoxigenic E coli (ETEC) NOT DETECTED NOT DETECTED Final   Shiga like toxin producing E coli (STEC) NOT DETECTED NOT DETECTED Final   Shigella/Enteroinvasive E coli (EIEC) NOT DETECTED NOT DETECTED Final   Cryptosporidium NOT DETECTED NOT DETECTED Final   Cyclospora cayetanensis NOT DETECTED NOT DETECTED Final   Entamoeba histolytica NOT DETECTED NOT DETECTED Final   Giardia lamblia NOT DETECTED NOT DETECTED Final   Adenovirus F40/41 NOT DETECTED NOT DETECTED Final   Astrovirus NOT DETECTED NOT DETECTED Final   Norovirus GI/GII NOT DETECTED NOT DETECTED Final   Rotavirus A NOT DETECTED NOT DETECTED Final   Sapovirus (I, II, IV, and V) NOT DETECTED NOT DETECTED Final    Time coordinating discharge: Over 30 minutes  Hazeline Junker, MD  [facility] 09/01/2015, 5:25 PM Pager  [phone]  If 7PM-7AM, please contact night-coverage [email or web address] Password [email or web address]

## 2015-09-01 NOTE — Anesthesia Postprocedure Evaluation (Signed)
Anesthesia Post Note  Patient: Elita QuickRonald Ruan  Procedure(s) Performed: Procedure(s) (LRB): ESOPHAGOGASTRODUODENOSCOPY (EGD) (N/A)  Patient location during evaluation: PACU Anesthesia Type: MAC Level of consciousness: awake and alert Pain management: pain level controlled Vital Signs Assessment: post-procedure vital signs reviewed and stable Respiratory status: spontaneous breathing, nonlabored ventilation, respiratory function stable and patient connected to nasal cannula oxygen Cardiovascular status: stable and blood pressure returned to baseline Anesthetic complications: no    Last Vitals:  Vitals:   09/01/15 1035 09/01/15 1045  BP: (!) 101/59 102/73  Pulse: 61 (!) 57  Resp: 17 16  Temp:      Last Pain:  Vitals:   09/01/15 1027  TempSrc: Oral                 Shelton SilvasKevin D Prosperity Darrough

## 2015-09-01 NOTE — Anesthesia Preprocedure Evaluation (Addendum)
Anesthesia Evaluation  Patient identified by MRN, date of birth, ID band Patient awake    Reviewed: Allergy & Precautions, NPO status , Patient's Chart, lab work & pertinent test results, reviewed documented beta blocker date and time   Airway Mallampati: I  TM Distance: >3 FB     Dental  (+) Upper Dentures, Lower Dentures   Pulmonary asthma , COPD,  COPD inhaler, Current Smoker,    breath sounds clear to auscultation       Cardiovascular hypertension, Pt. on home beta blockers and Pt. on medications  Rhythm:Regular Rate:Normal     Neuro/Psych negative neurological ROS  negative psych ROS   GI/Hepatic negative GI ROS, Neg liver ROS,   Endo/Other  negative endocrine ROS  Renal/GU Renal disease  negative genitourinary   Musculoskeletal negative musculoskeletal ROS (+)   Abdominal   Peds negative pediatric ROS (+)  Hematology negative hematology ROS (+)   Anesthesia Other Findings   Reproductive/Obstetrics negative OB ROS                            Lab Results  Component Value Date   WBC 9.7 09/01/2015   HGB 15.2 09/01/2015   HCT 44.9 09/01/2015   MCV 86.7 09/01/2015   PLT 175 09/01/2015   Lab Results  Component Value Date   CREATININE 0.99 09/01/2015   BUN 8 09/01/2015   NA 139 09/01/2015   K 3.8 09/01/2015   CL 112 (H) 09/01/2015   CO2 22 09/01/2015   Lab Results  Component Value Date   INR 1.10 08/31/2015   INR 1.08 08/30/2015   04/2015 EKG: normal sinus rhythm.  Anesthesia Physical Anesthesia Plan  ASA: III  Anesthesia Plan: MAC   Post-op Pain Management:    Induction: Intravenous  Airway Management Planned: Natural Airway  Additional Equipment:   Intra-op Plan:   Post-operative Plan:   Informed Consent: I have reviewed the patients History and Physical, chart, labs and discussed the procedure including the risks, benefits and alternatives for the proposed  anesthesia with the patient or authorized representative who has indicated his/her understanding and acceptance.     Plan Discussed with: CRNA  Anesthesia Plan Comments:         Anesthesia Quick Evaluation

## 2015-09-01 NOTE — Interval H&P Note (Signed)
History and Physical Interval Note:  09/01/2015 9:58 AM  John Banks  has presented today for surgery, with the diagnosis of hematemesis,  The various methods of treatment have been discussed with the patient and family. After consideration of risks, benefits and other options for treatment, the patient has consented to  Procedure(s): ESOPHAGOGASTRODUODENOSCOPY (EGD) (N/A) as a surgical intervention .  The patient's history has been reviewed, patient examined, no change in status, stable for surgery.  I have reviewed the patient's chart and labs.  Questions were answered to the patient's satisfaction.     Corliss Coggeshall

## 2015-09-02 ENCOUNTER — Telehealth: Payer: Self-pay | Admitting: *Deleted

## 2015-09-02 NOTE — Telephone Encounter (Signed)
Patient discharged yesterday on PPI therapy for GI bleed. Appt scheduled with Dr Dettinger for 9/6//17. He is aware that he should call if he has any rectal bleeding or increase in weakness.   He would also like to discuss smoking cessation and Chantix at this visit.

## 2015-09-09 ENCOUNTER — Ambulatory Visit (INDEPENDENT_AMBULATORY_CARE_PROVIDER_SITE_OTHER): Payer: Self-pay | Admitting: Family Medicine

## 2015-09-09 ENCOUNTER — Encounter: Payer: Self-pay | Admitting: Family Medicine

## 2015-09-09 VITALS — BP 115/76 | HR 73 | Temp 97.6°F | Ht 73.0 in | Wt 149.8 lb

## 2015-09-09 DIAGNOSIS — K226 Gastro-esophageal laceration-hemorrhage syndrome: Secondary | ICD-10-CM

## 2015-09-09 DIAGNOSIS — N179 Acute kidney failure, unspecified: Secondary | ICD-10-CM

## 2015-09-09 MED ORDER — PANTOPRAZOLE SODIUM 20 MG PO TBEC: 20.0000 mg | DELAYED_RELEASE_TABLET | Freq: Every day | ORAL | 2 refills | Status: DC

## 2015-09-09 MED ORDER — NITROGLYCERIN 0.4 MG SL SUBL: 0.4000 mg | SUBLINGUAL_TABLET | SUBLINGUAL | 0 refills | Status: AC | PRN

## 2015-09-09 NOTE — Progress Notes (Signed)
BP 115/76   Pulse 73   Temp 97.6 F (36.4 C) (Oral)   Ht '6\' 1"'$  (1.854 m)   Wt 149 lb 12.8 oz (67.9 kg)   BMI 19.76 kg/m    Subjective:    Patient ID: John Banks, male    DOB: 1960-01-07, 55 y.o.   MRN: [id number]  HPI: John Banks is a 55 y.o. male presenting on 09/09/2015 for Hospital followup and Discuss Zocor (causing nausea and vomiting)   HPI Hospital follow-up Patient was in the hospital for rectal bleeding and was admitted on 08/30/2015 and discharged on 09/01/2015. He feels like it was the simvastatin that caused him to have some much vomiting and make him sick to his stomach. He stopped the simvastatin for a few days after the hospital visit and he stopped vomiting and then he restarted it and he started having the vomiting again over the past 2 days. He is going to stop the simvastatin and we will readdress the cholesterol in the future. He denies any more blood in his stool or blood in his vomit. He denies any abdominal pain or flank pain or pain radiating into his back today. He is still taking his Protonix. He also had a slight kidney bump because of dehydration in the hospital and they recommended follow-up for both that and his blood counts to make sure he was not becoming anemic.  Relevant past medical, surgical, family and social history reviewed and updated as indicated. Interim medical history since our last visit reviewed. Allergies and medications reviewed and updated.  Review of Systems  Constitutional: Negative for chills and fever.  Eyes: Negative for discharge.  Respiratory: Negative for shortness of breath and wheezing.   Cardiovascular: Negative for chest pain and leg swelling.  Gastrointestinal: Positive for blood in stool, nausea and vomiting. Negative for abdominal pain, constipation, diarrhea and rectal pain.  Musculoskeletal: Negative for back pain and gait problem.  Skin: Negative for rash.  All other systems reviewed and are negative.   Per HPI  unless specifically indicated above     Medication List       Accurate as of 09/09/15  4:28 PM. Always use your most recent med list.          acetaminophen 325 MG tablet Commonly known as:  TYLENOL Take 325-650 mg by mouth every 6 (six) hours as needed for mild pain.   cholestyramine 4 GM/DOSE powder Commonly known as:  QUESTRAN MIX 4 GRAMS( 1 SCOOP) IN LIQUID 2 TIMES A DAY WITH A MEAL   metoprolol tartrate 25 MG tablet Commonly known as:  LOPRESSOR Take 1 tablet (25 mg total) by mouth 2 (two) times daily.   nitroGLYCERIN 0.4 MG SL tablet Commonly known as:  NITROSTAT Place 1 tablet (0.4 mg total) under the tongue every 5 (five) minutes as needed for chest pain.   pantoprazole 20 MG tablet Commonly known as:  PROTONIX Take 1 tablet (20 mg total) by mouth daily.          Objective:    BP 115/76   Pulse 73   Temp 97.6 F (36.4 C) (Oral)   Ht '6\' 1"'$  (1.854 m)   Wt 149 lb 12.8 oz (67.9 kg)   BMI 19.76 kg/m   Wt Readings from Last 3 Encounters:  09/09/15 149 lb 12.8 oz (67.9 kg)  09/01/15 153 lb (69.4 kg)  04/29/15 153 lb 12.8 oz (69.8 kg)    Physical Exam  Constitutional: He is oriented to person,  place, and time. He appears well-developed and well-nourished. No distress.  Eyes: Conjunctivae are normal. Right eye exhibits no discharge. No scleral icterus.  Cardiovascular: Normal rate, regular rhythm, normal heart sounds and intact distal pulses.   No murmur heard. Pulmonary/Chest: Effort normal and breath sounds normal. No respiratory distress. He has no wheezes.  Abdominal: Soft. Bowel sounds are normal. He exhibits no distension. There is no tenderness. There is no rigidity, no rebound, no guarding and no CVA tenderness.  Musculoskeletal: Normal range of motion. He exhibits no edema.  Neurological: He is alert and oriented to person, place, and time. Coordination normal.  Skin: Skin is warm and dry. No rash noted. He is not diaphoretic.  Psychiatric: He has a  normal mood and affect. His behavior is normal.  Nursing note and vitals reviewed.     Assessment & Plan:   Problem List Items Addressed This Visit      Digestive   Hematemesis with nausea - Primary     Genitourinary   AKI (acute kidney injury) ([place])   Relevant Orders   CMP14+EGFR    Other Visit Diagnoses   None.      Follow up plan: Return in about 3 months (around 12/09/2015), or if symptoms worsen or fail to improve.  Counseling provided for all of the vaccine components Orders Placed This Encounter  Procedures  . CBC with Differential/Platelet  . Tyrrell Zaynah Chawla, MD [facility] 09/09/2015, 4:28 PM

## 2015-09-10 LAB — CMP14+EGFR
ALBUMIN: 4.3 g/dL (ref 3.5–5.5)
ALK PHOS: 79 IU/L (ref 39–117)
ALT: 40 IU/L (ref 0–44)
AST: 24 IU/L (ref 0–40)
Albumin/Globulin Ratio: 1.9 (ref 1.2–2.2)
BILIRUBIN TOTAL: 0.5 mg/dL (ref 0.0–1.2)
BUN / CREAT RATIO: 11 (ref 9–20)
BUN: 11 mg/dL (ref 6–24)
CHLORIDE: 100 mmol/L (ref 96–106)
CO2: 28 mmol/L (ref 18–29)
Calcium: 9.5 mg/dL (ref 8.7–10.2)
Creatinine, Ser: 1.04 mg/dL (ref 0.76–1.27)
GFR calc Af Amer: 94 mL/min/{1.73_m2} (ref >59)
GFR calc non Af Amer: 81 mL/min/{1.73_m2} (ref >59)
GLUCOSE: 98 mg/dL (ref 65–99)
Globulin, Total: 2.3 g/dL (ref 1.5–4.5)
Potassium: 4 mmol/L (ref 3.5–5.2)
SODIUM: 143 mmol/L (ref 134–144)
Total Protein: 6.6 g/dL (ref 6.0–8.5)

## 2015-09-10 LAB — CBC WITH DIFFERENTIAL/PLATELET
BASOS ABS: 0 10*3/uL (ref 0.0–0.2)
Basos: 0 %
EOS (ABSOLUTE): 0.3 10*3/uL (ref 0.0–0.4)
Eos: 3 %
HEMOGLOBIN: 16.1 g/dL (ref 12.6–17.7)
Hematocrit: 46.1 % (ref 37.5–51.0)
Immature Grans (Abs): 0 10*3/uL (ref 0.0–0.1)
Immature Granulocytes: 0 %
LYMPHS ABS: 2.6 10*3/uL (ref 0.7–3.1)
Lymphs: 29 %
MCH: 29.7 pg (ref 26.6–33.0)
MCHC: 34.9 g/dL (ref 31.5–35.7)
MCV: 85 fL (ref 79–97)
MONOS ABS: 0.7 10*3/uL (ref 0.1–0.9)
Monocytes: 8 %
NEUTROS ABS: 5.5 10*3/uL (ref 1.4–7.0)
Neutrophils: 60 %
Platelets: 235 10*3/uL (ref 150–379)
RBC: 5.42 x10E6/uL (ref 4.14–5.80)
RDW: 14 % (ref 12.3–15.4)
WBC: 9.2 10*3/uL (ref 3.4–10.8)

## 2015-09-29 ENCOUNTER — Ambulatory Visit: Payer: Self-pay | Admitting: Family Medicine

## 2015-09-30 ENCOUNTER — Encounter: Payer: Self-pay | Admitting: Family Medicine

## 2015-10-08 ENCOUNTER — Other Ambulatory Visit: Payer: Self-pay

## 2015-10-08 MED ORDER — PANTOPRAZOLE SODIUM 20 MG PO TBEC: 20.0000 mg | DELAYED_RELEASE_TABLET | Freq: Every day | ORAL | 0 refills | Status: DC

## 2015-11-05 ENCOUNTER — Other Ambulatory Visit: Payer: Self-pay | Admitting: Family Medicine

## 2015-12-09 ENCOUNTER — Encounter: Payer: Self-pay | Admitting: Family Medicine

## 2015-12-09 ENCOUNTER — Ambulatory Visit (INDEPENDENT_AMBULATORY_CARE_PROVIDER_SITE_OTHER): Payer: Self-pay | Admitting: Family Medicine

## 2015-12-09 VITALS — BP 135/84 | HR 85 | Temp 97.3°F | Ht 73.0 in | Wt 154.0 lb

## 2015-12-09 DIAGNOSIS — I10 Essential (primary) hypertension: Secondary | ICD-10-CM

## 2015-12-09 DIAGNOSIS — E785 Hyperlipidemia, unspecified: Secondary | ICD-10-CM

## 2015-12-09 MED ORDER — CHOLESTYRAMINE 4 GM/DOSE PO POWD: ORAL | 5 refills | Status: DC

## 2015-12-09 MED ORDER — METOPROLOL TARTRATE 25 MG PO TABS: 25.0000 mg | ORAL_TABLET | Freq: Two times a day (BID) | ORAL | 2 refills | Status: DC

## 2015-12-09 NOTE — Progress Notes (Signed)
BP 135/84   Pulse 85   Temp 97.3 F (36.3 C) (Oral)   Ht '6\' 1"'$  (1.854 m)   Wt 154 lb (69.9 kg)   BMI 20.32 kg/m    Subjective:    Patient ID: John Banks, male    DOB: [birth year], 55 y.o.   MRN: [id number]  HPI: Aldred Banks is a 55 y.o. male presenting on 12/09/2015 for Hyperlipidemia (followup; out of Questran Powder x 1 month; patient is fasting) and Hypertension   HPI Hyperlipidemia recheck Patient is coming in today for cholesterol recheck. He has been taking the Questran for both cholesterol and for stomach issues. He has been off of it for a month and has been doing well. He is due for recheck on labs and we will see where he is at. He is self pay so he has to wait to come due until he has his money. He denies any myalgias. Patient denies headaches, blurred vision, chest pains, shortness of breath, or weakness. Denies any side effects from medication and is content with current medication.   Hypertension recheck Patient has been off of his metoprolol for the past 2 weeks because he ran out. His blood pressure today is 135/84. He would like to continue to monitor for time and see if he actually still needs it. He will come back to [place] or to discuss back with [place] if it becomes elevated again like he was before.  Relevant past medical, surgical, family and social history reviewed and updated as indicated. Interim medical history since our last visit reviewed. Allergies and medications reviewed and updated.  Review of Systems  Constitutional: Negative for chills and fever.  HENT: Positive for sinus pressure (Chronic sinus pressure).   Eyes: Negative for discharge.  Respiratory: Positive for cough (Chronic cough from smoking). Negative for shortness of breath and wheezing.   Cardiovascular: Negative for chest pain and leg swelling.  Musculoskeletal: Negative for back pain and gait problem.  Skin: Negative for rash.  Neurological: Negative for weakness, numbness and headaches.    All other systems reviewed and are negative.   Per HPI unless specifically indicated above      Objective:    BP 135/84   Pulse 85   Temp 97.3 F (36.3 C) (Oral)   Ht '6\' 1"'$  (1.854 m)   Wt 154 lb (69.9 kg)   BMI 20.32 kg/m   Wt Readings from Last 3 Encounters:  12/09/15 154 lb (69.9 kg)  09/09/15 149 lb 12.8 oz (67.9 kg)  09/01/15 153 lb (69.4 kg)    Physical Exam  Constitutional: He is oriented to person, place, and time. He appears well-developed and well-nourished. No distress.  Eyes: Conjunctivae are normal. Right eye exhibits no discharge. Left eye exhibits no discharge. No scleral icterus.  Cardiovascular: Normal rate, regular rhythm, normal heart sounds and intact distal pulses.   No murmur heard. Pulmonary/Chest: Effort normal and breath sounds normal. No respiratory distress. He has no wheezes. He has no rales.  Musculoskeletal: Normal range of motion. He exhibits no edema.  Neurological: He is alert and oriented to person, place, and time. Coordination normal.  Skin: Skin is warm and dry. No rash noted. He is not diaphoretic.  Psychiatric: He has a normal mood and affect. His behavior is normal.  Nursing note and vitals reviewed.     Assessment & Plan:   Problem List Items Addressed This Visit      Cardiovascular and Mediastinum   Essential hypertension, benign -  Primary   Relevant Medications   cholestyramine (QUESTRAN) 4 GM/DOSE powder   metoprolol tartrate (LOPRESSOR) 25 MG tablet   Other Relevant Orders   CMP14+EGFR     Other   Hyperlipidemia LDL goal <130   Relevant Medications   cholestyramine (QUESTRAN) 4 GM/DOSE powder   metoprolol tartrate (LOPRESSOR) 25 MG tablet   Other Relevant Orders   Lipid panel      For chronic sinus pressure. Patient handout with medications that he can use such as Flonase and Mucinex and nasal saline and an antihistamine to help with these issues.  Follow up plan: Return in about 6 months (around 06/08/2016), or if  symptoms worsen or fail to improve, for Hypertension and cholesterol follow up.  Counseling provided for all of the vaccine components Orders Placed This Encounter  Procedures  . CMP14+EGFR  . Lipid panel    Caryl Pina, MD [facility] 12/09/2015, 8:37 AM

## 2015-12-09 NOTE — Addendum Note (Signed)
Addended by: Prescott GumLAND, Stefan Markarian M on: 12/09/2015 08:44 AM   Modules accepted: Orders

## 2015-12-10 LAB — CMP14+EGFR
ALT: 16 IU/L (ref 0–44)
AST: 17 IU/L (ref 0–40)
Albumin/Globulin Ratio: 1.9 (ref 1.2–2.2)
Albumin: 4.6 g/dL (ref 3.5–5.5)
Alkaline Phosphatase: 71 IU/L (ref 39–117)
BUN/Creatinine Ratio: 14 (ref 9–20)
BUN: 14 mg/dL (ref 6–24)
Bilirubin Total: 0.3 mg/dL (ref 0.0–1.2)
CALCIUM: 9.6 mg/dL (ref 8.7–10.2)
CO2: 24 mmol/L (ref 18–29)
CREATININE: 0.98 mg/dL (ref 0.76–1.27)
Chloride: 103 mmol/L (ref 96–106)
GFR calc Af Amer: 101 mL/min/{1.73_m2} (ref >59)
GFR, EST NON AFRICAN AMERICAN: 87 mL/min/{1.73_m2} (ref >59)
Globulin, Total: 2.4 g/dL (ref 1.5–4.5)
Glucose: 98 mg/dL (ref 65–99)
Potassium: 4.3 mmol/L (ref 3.5–5.2)
Sodium: 144 mmol/L (ref 134–144)
Total Protein: 7 g/dL (ref 6.0–8.5)

## 2015-12-10 LAB — LIPID PANEL
CHOL/HDL RATIO: 4.5 ratio (ref 0.0–5.0)
Cholesterol, Total: 201 mg/dL — ABNORMAL HIGH (ref 100–199)
HDL: 45 mg/dL (ref >39)
LDL CALC: 135 mg/dL — AB (ref 0–99)
TRIGLYCERIDES: 104 mg/dL (ref 0–149)
VLDL Cholesterol Cal: 21 mg/dL (ref 5–40)

## 2015-12-10 NOTE — Progress Notes (Signed)
Patient aware.

## 2016-03-08 ENCOUNTER — Other Ambulatory Visit: Payer: Self-pay | Admitting: Family Medicine

## 2016-06-08 ENCOUNTER — Ambulatory Visit (INDEPENDENT_AMBULATORY_CARE_PROVIDER_SITE_OTHER): Payer: Self-pay | Admitting: Family Medicine

## 2016-06-08 ENCOUNTER — Encounter: Payer: Self-pay | Admitting: Family Medicine

## 2016-06-08 ENCOUNTER — Encounter (INDEPENDENT_AMBULATORY_CARE_PROVIDER_SITE_OTHER): Payer: Self-pay

## 2016-06-08 VITALS — BP 127/76 | HR 62 | Temp 97.4°F | Ht 73.0 in | Wt 167.0 lb

## 2016-06-08 DIAGNOSIS — E785 Hyperlipidemia, unspecified: Secondary | ICD-10-CM

## 2016-06-08 DIAGNOSIS — I10 Essential (primary) hypertension: Secondary | ICD-10-CM

## 2016-06-08 NOTE — Progress Notes (Signed)
BP 127/76   Pulse 62   Temp 97.4 F (36.3 C) (Oral)   Ht 6\' 1"  (1.854 m)   Wt 167 lb (75.8 kg)   BMI 22.03 kg/m    Subjective:    Patient ID: John Banks, male    DOB: 1960/07/25, 56 y.o.   MRN: [id number]  HPI: John Banks is a 56 y.o. male presenting on 06/08/2016 for Hyperlipidemia (6 mo; patient has stopped Metoprolol & Questran; quit smoking in April) and Hypertension   HPI Hyperlipidemia Patient is coming in for recheck of his hyperlipidemia. He is currently taking Nothing but diet control, we will recheck his labs. He denies any issues with myalgias or history of liver damage from it. He denies any focal numbness or weakness or chest pain.  Hypertension Patient is currently on nothing but diet controlled, he self stopped all of his medications, and her blood pressure today is 127/76. Patient denies any lightheadedness or dizziness. Patient denies headaches, blurred vision, chest pains, shortness of breath, or weakness. Denies any side effects from medication and is content with current medication.  Patient also has a history of a GI bleed with GERD and was on protonic for it but self stopped times as well. He denies any further GI bleeds or dark tarry stools. He denies any abdominal pain.  Relevant past medical, surgical, family and social history reviewed and updated as indicated. Interim medical history since our last visit reviewed. Allergies and medications reviewed and updated.  Review of Systems  Constitutional: Negative for chills and fever.  Eyes: Negative for discharge.  Respiratory: Negative for shortness of breath and wheezing.   Cardiovascular: Negative for chest pain and leg swelling.  Gastrointestinal: Negative for abdominal distention, abdominal pain, blood in stool, constipation, diarrhea, nausea and vomiting.  Musculoskeletal: Negative for back pain and gait problem.  Skin: Negative for rash.  All other systems reviewed and are negative.   Per HPI  unless specifically indicated above      Objective:    BP 127/76   Pulse 62   Temp 97.4 F (36.3 C) (Oral)   Ht 6\' 1"  (1.854 m)   Wt 167 lb (75.8 kg)   BMI 22.03 kg/m   Wt Readings from Last 3 Encounters:  06/08/16 167 lb (75.8 kg)  12/09/15 154 lb (69.9 kg)  09/09/15 149 lb 12.8 oz (67.9 kg)    Physical Exam  Constitutional: He is oriented to person, place, and time. He appears well-developed and well-nourished. No distress.  Eyes: Conjunctivae are normal. No scleral icterus.  Neck: Neck supple. No thyromegaly present.  Cardiovascular: Normal rate, regular rhythm, normal heart sounds and intact distal pulses.   No murmur heard. Pulmonary/Chest: Effort normal and breath sounds normal. No respiratory distress. He has no wheezes.  Abdominal: Soft. Bowel sounds are normal. He exhibits no distension. There is no tenderness. There is no rebound.  Musculoskeletal: Normal range of motion. He exhibits no edema.  Lymphadenopathy:    He has no cervical adenopathy.  Neurological: He is alert and oriented to person, place, and time. Coordination normal.  Skin: Skin is warm and dry. No rash noted. He is not diaphoretic.  Psychiatric: He has a normal mood and affect. His behavior is normal.  Nursing note and vitals reviewed.       Assessment & Plan:   Problem List Items Addressed This Visit      Cardiovascular and Mediastinum   Essential hypertension, benign - Primary     Other  Hyperlipidemia LDL goal <130      Patient is off all medications and seems to be doing well, we'll just monitor for now. Follow up plan: Return in about 1 year (around 06/08/2017), or if symptoms worsen or fail to improve.  Counseling provided for all of the vaccine components No orders of the defined types were placed in this encounter.   Arville CareJoshua Dettinger, MD [facility] 06/08/2016, 4:59 PM

## 2016-09-06 IMAGING — CR DG ABDOMEN ACUTE W/ 1V CHEST
3 series · 3 of 3 positions shown · non-contrast
Comparison: 04/25/2015

CLINICAL DATA: Nausea vomiting and diarrhea today with tingling and
numbness. Syncope.

EXAM:
DG ABDOMEN ACUTE W/ 1V CHEST

[abdomen supine]
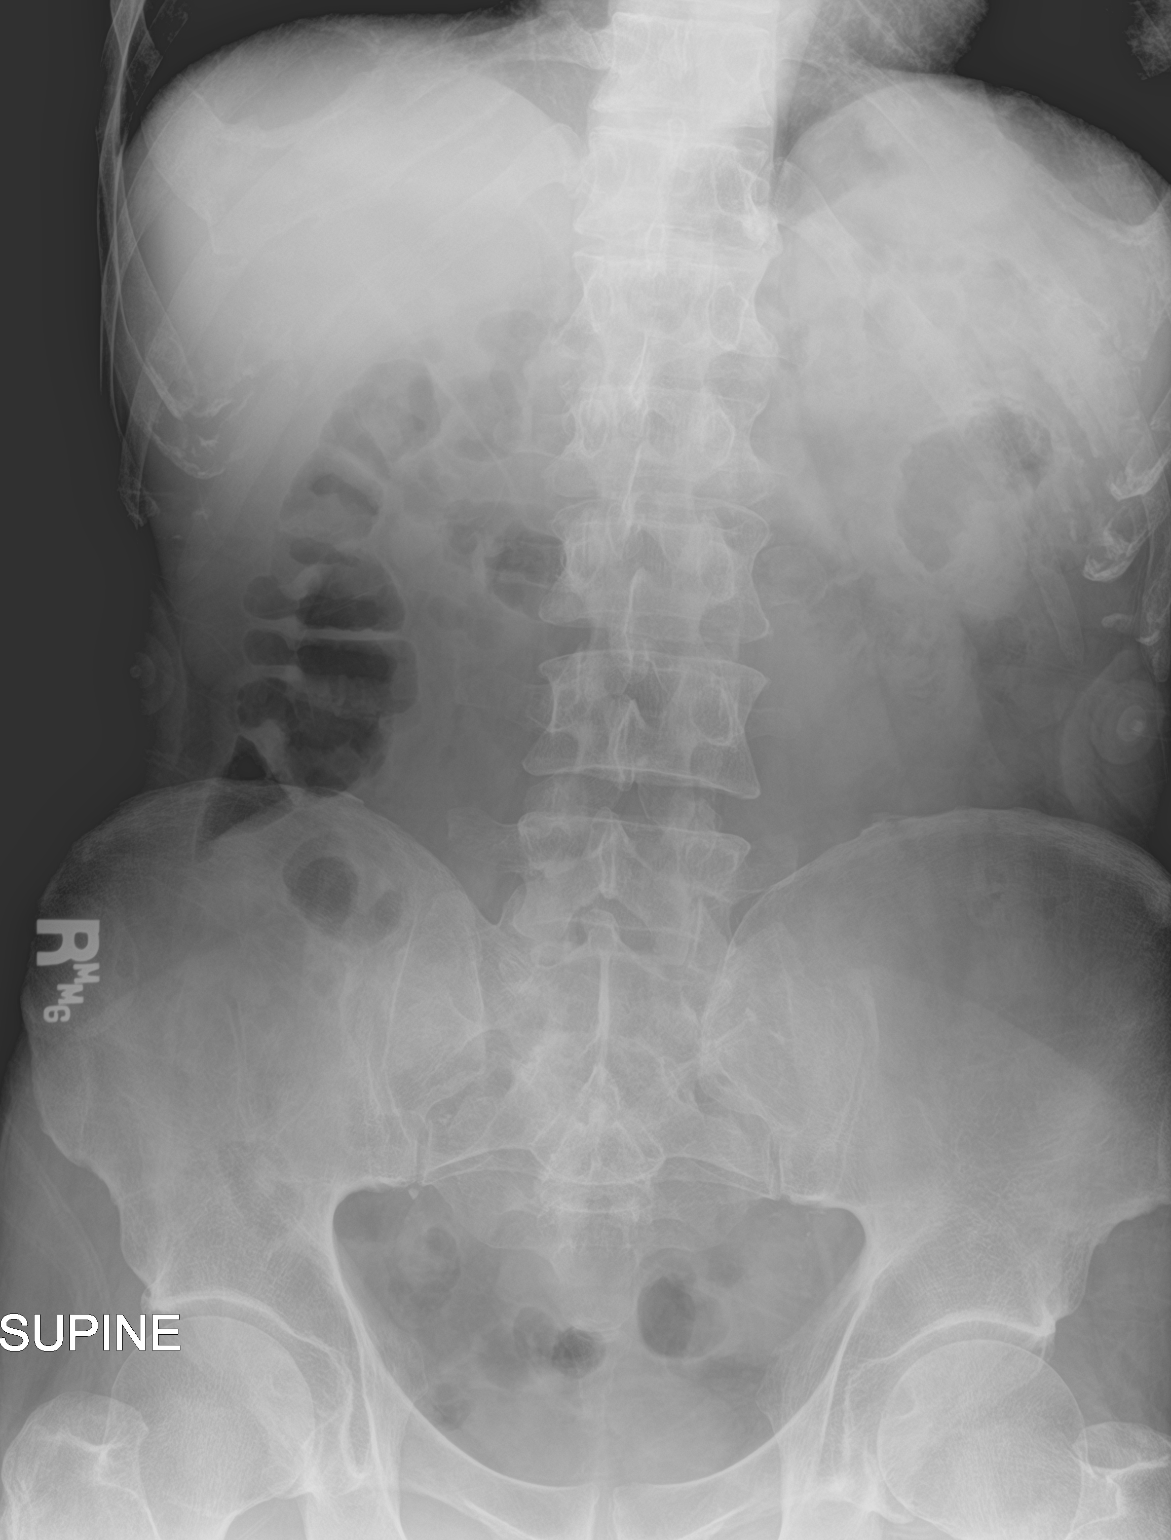

[abdomen decu]
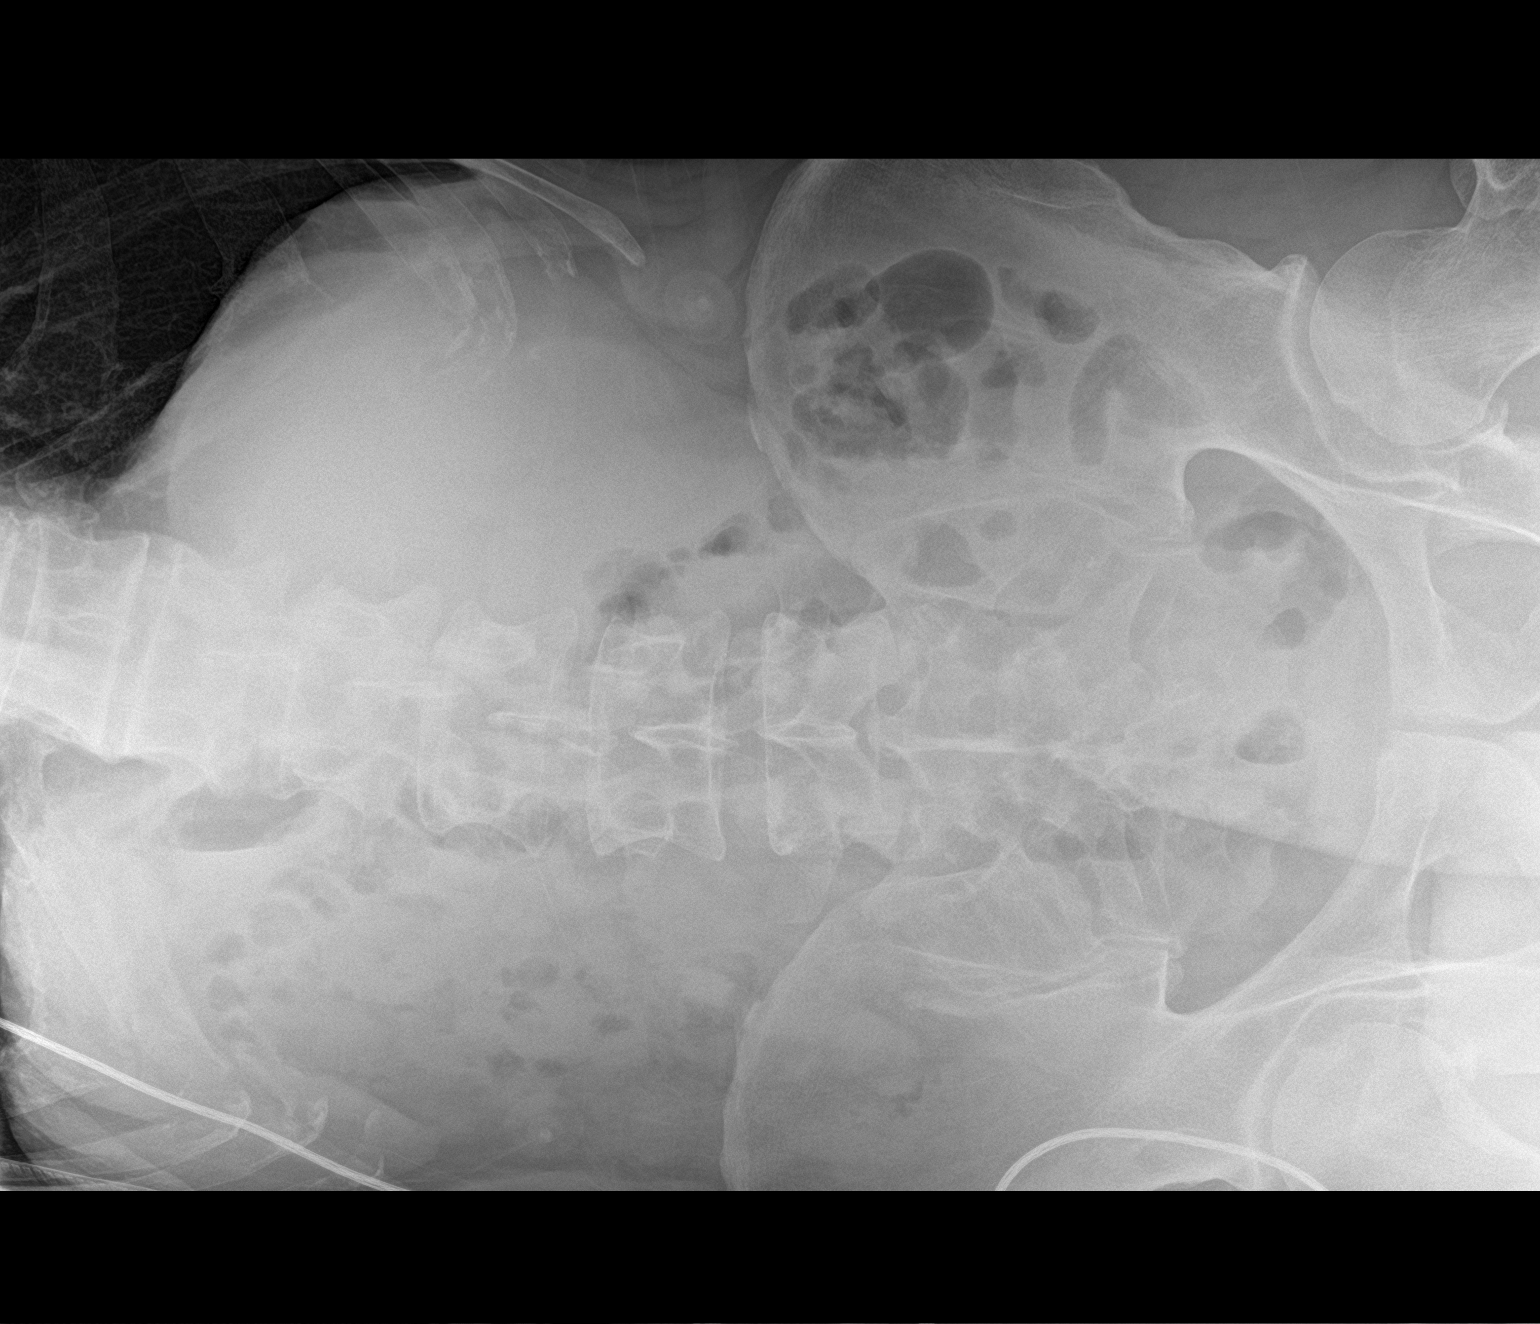

[chest ap]
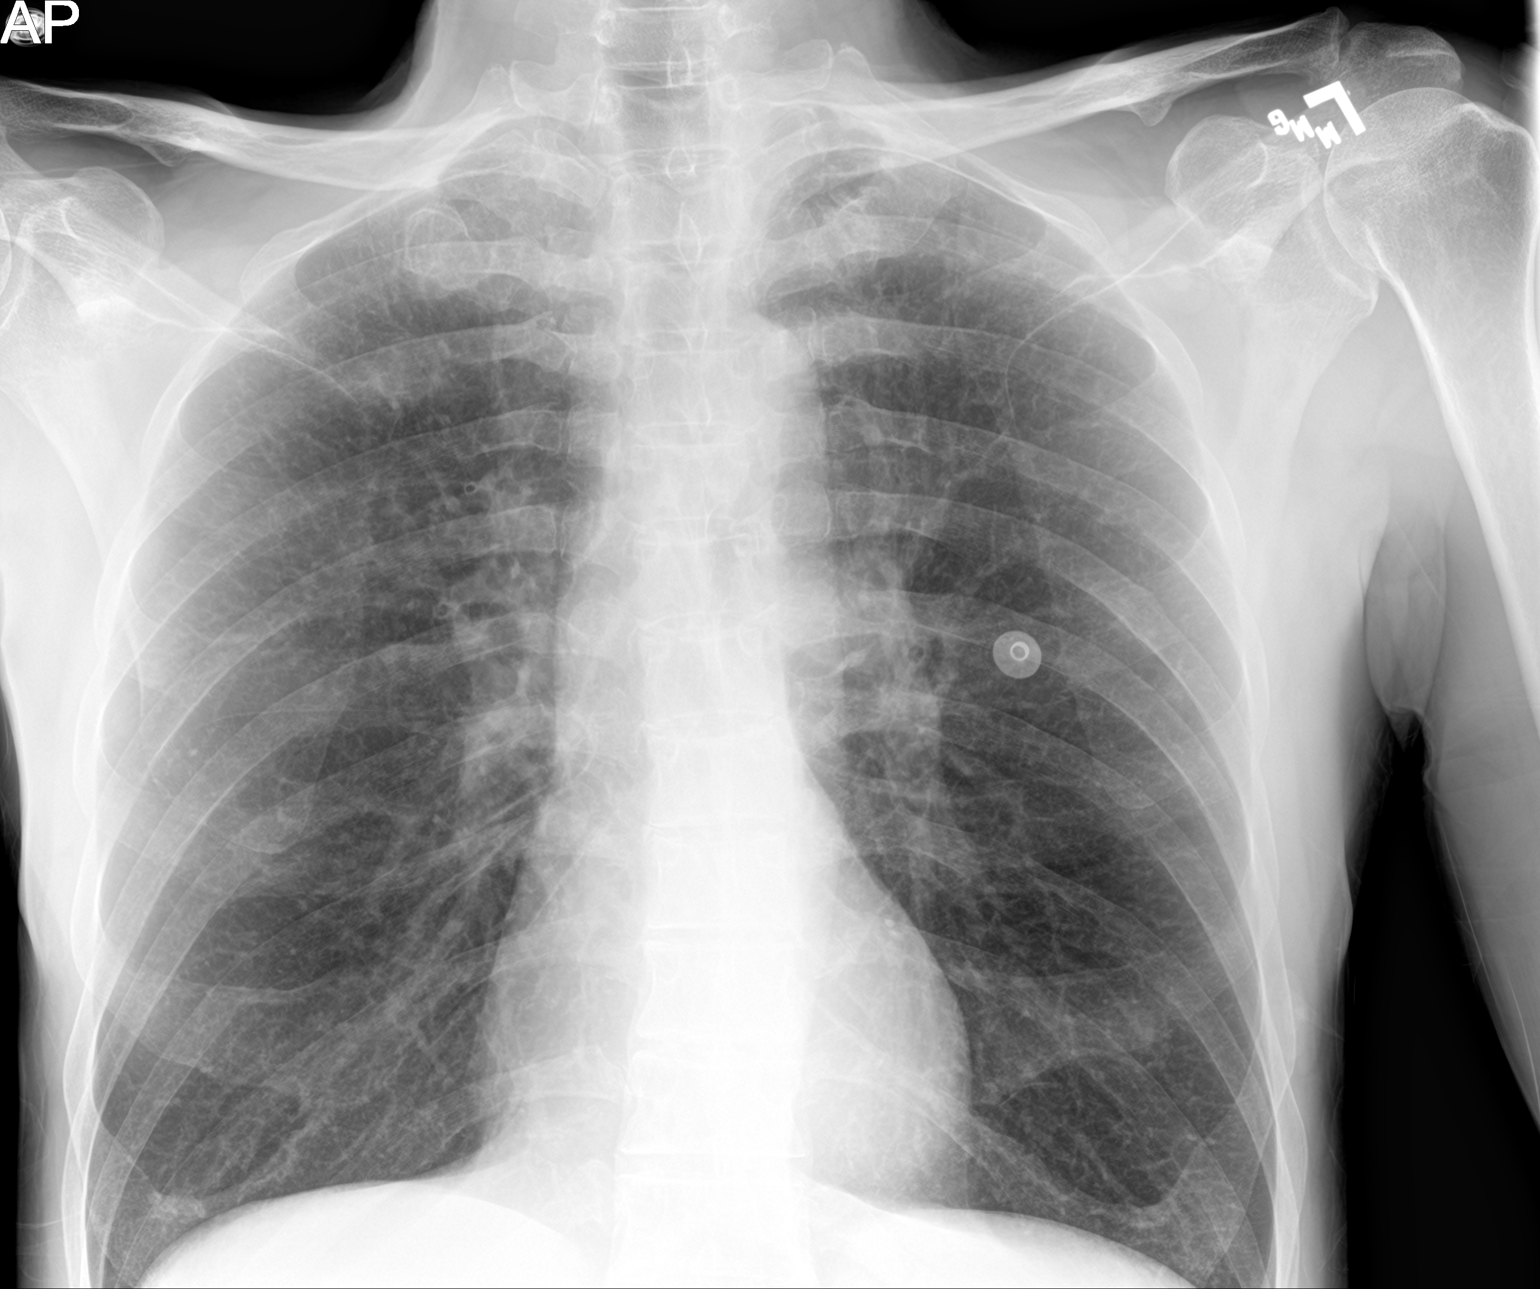

[3 of 3 positions shown; findings below may reference images not displayed]

FINDINGS: Lungs are hyperexpanded. The lungs are clear wiithout focal
pneumonia, edema, pneumothorax or pleural effusion. Interstitial
markings are diffusely coarsened with chronic features. The
cardiopericardial silhouette is within normal limits for size.

Decubitus view the abdomen shows no intraperitoneal free air. Supine
film shows normal bowel gas pattern. Visualized bony anatomy is
unremarkable.
IMPRESSION: Negative abdominal radiographs.  No acute cardiopulmonary disease.

## 2016-09-06 IMAGING — CT CT ABD-PELV W/ CM
2 of 4 series · 11 of 46 positions shown, 12 images · IV contrast (Iodine)
Comparison: Radiographs 08/30/2015

CLINICAL DATA: Hematemesis, onset at [DATE].  Diarrhea.

EXAM:
CT ABDOMEN AND PELVIS WITH CONTRAST
TECHNIQUE: Multidetector CT imaging of the abdomen and pelvis was performed
using the standard protocol following bolus administration of
intravenous contrast.
CONTRAST:  80mL 346BFB-THH IOPAMIDOL (346BFB-THH) INJECTION 61%

[Series 201: routine, idose (2) · axial · 0.74mm/px · z∈[+44,+419]mm · 8 of 91 slices shown, 9 images]
[im 8/91  soft-tissue]
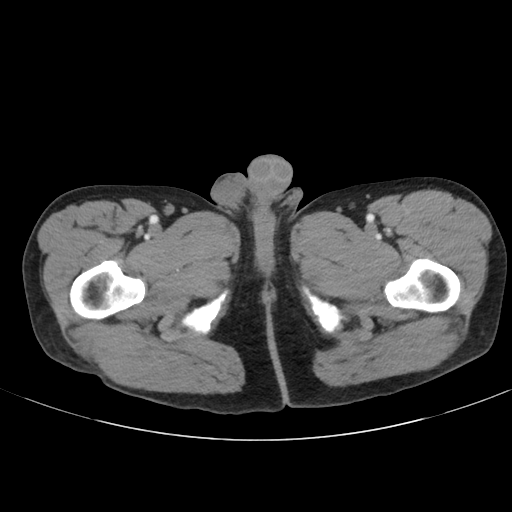
[im 8/91  bone]
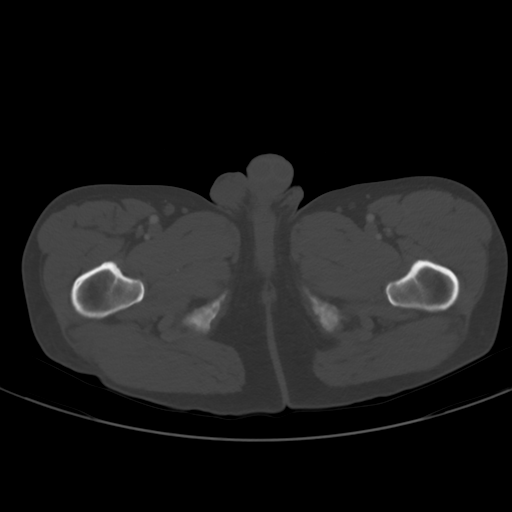
[im 20/91  soft-tissue]
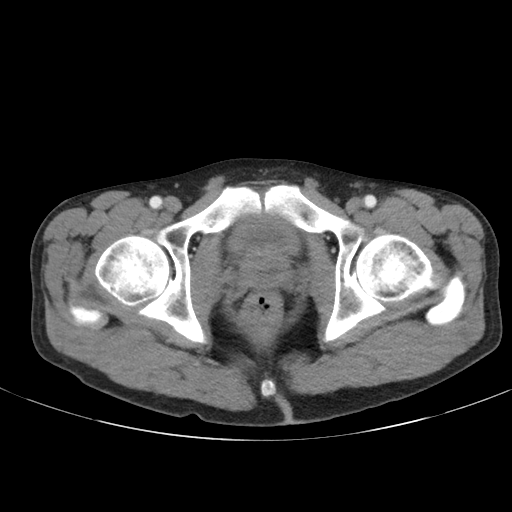
[im 28/91  soft-tissue]
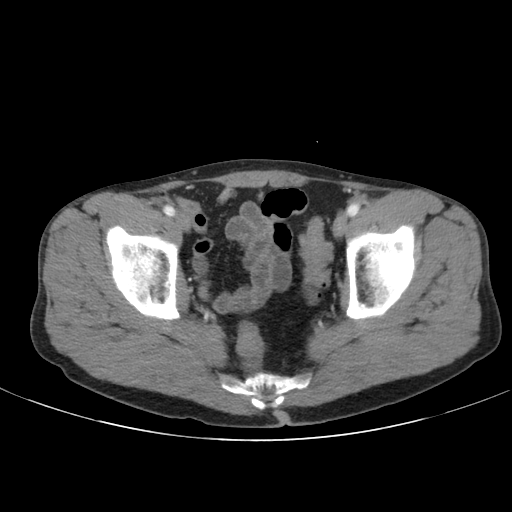
[im 40/91  soft-tissue]
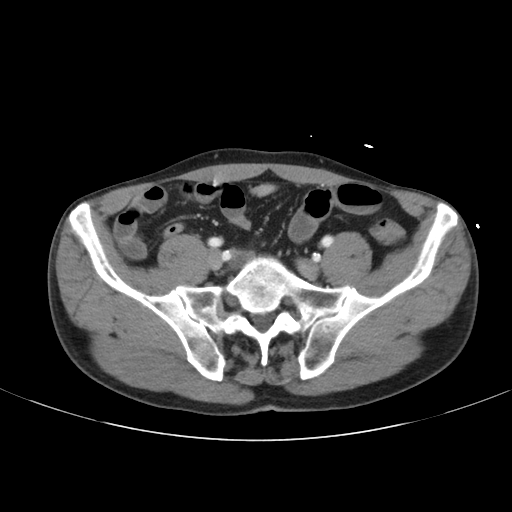
[im 51/91  soft-tissue]
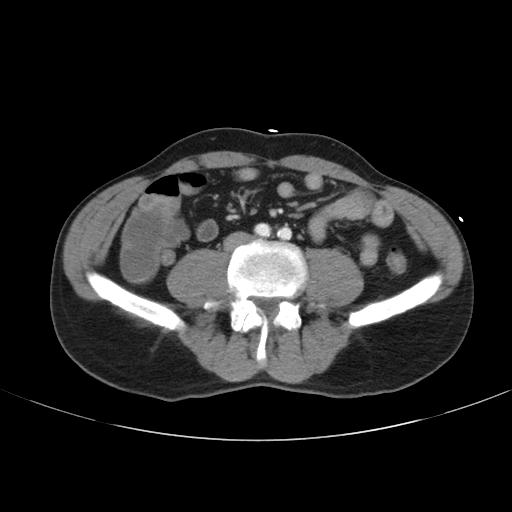
[im 63/91  soft-tissue]
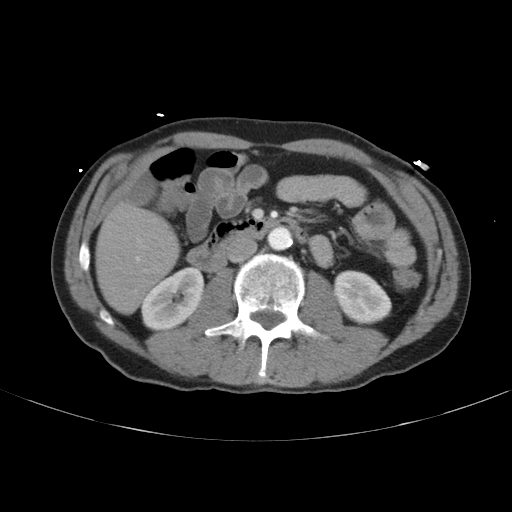
[im 71/91  soft-tissue]
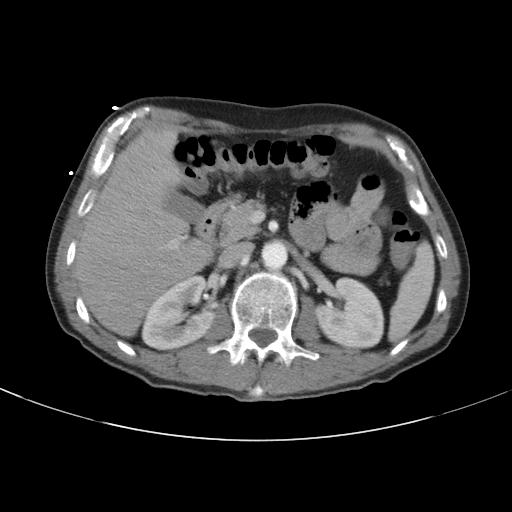
[im 83/91  soft-tissue]
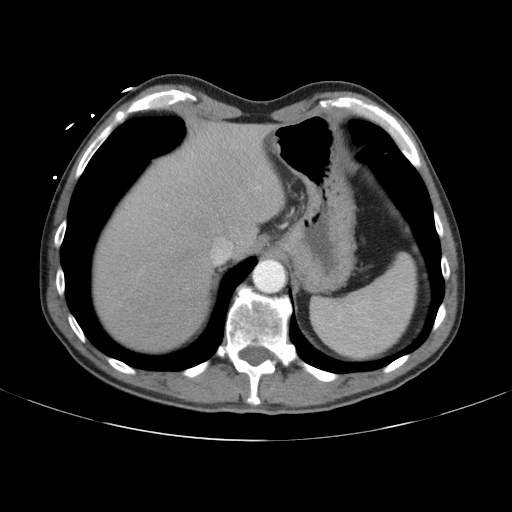

[Series 203: coronals, idose (2) · coronal · 0.45mm/px · 3 of 110 slices shown]
[im 37/110  soft-tissue]
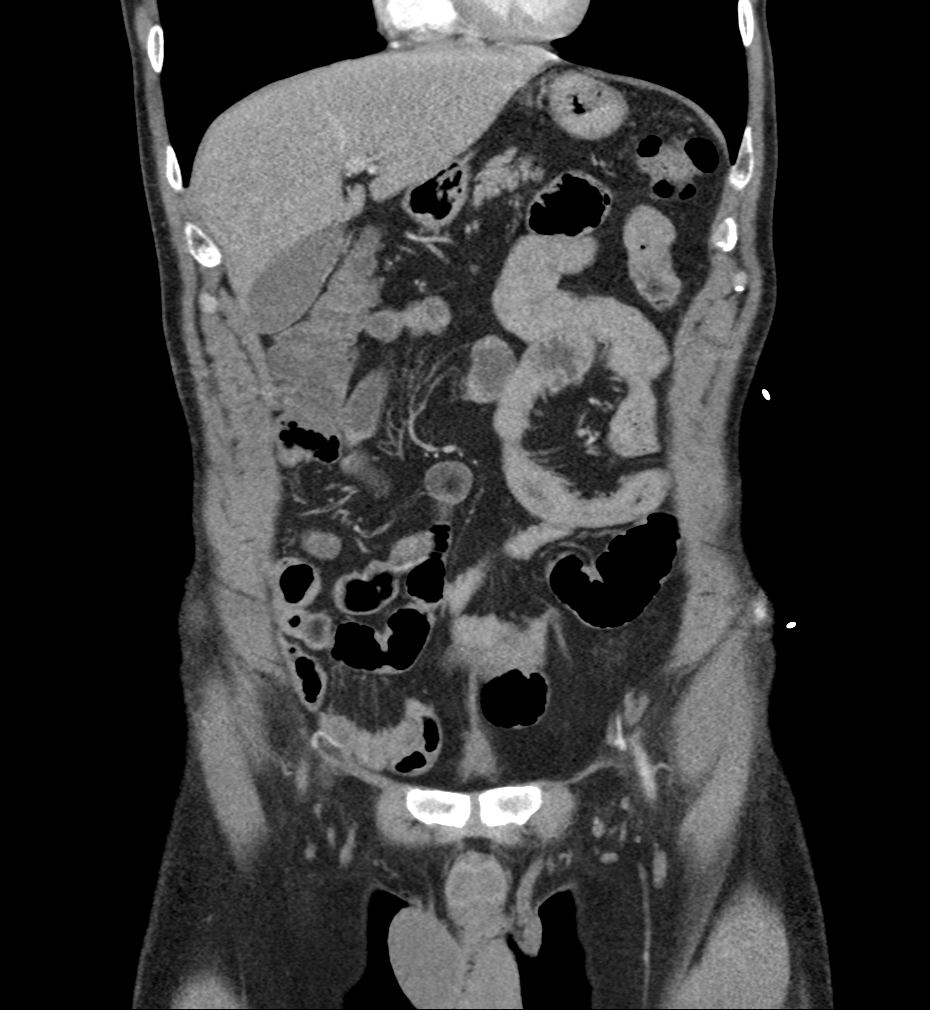
[im 49/110  soft-tissue]
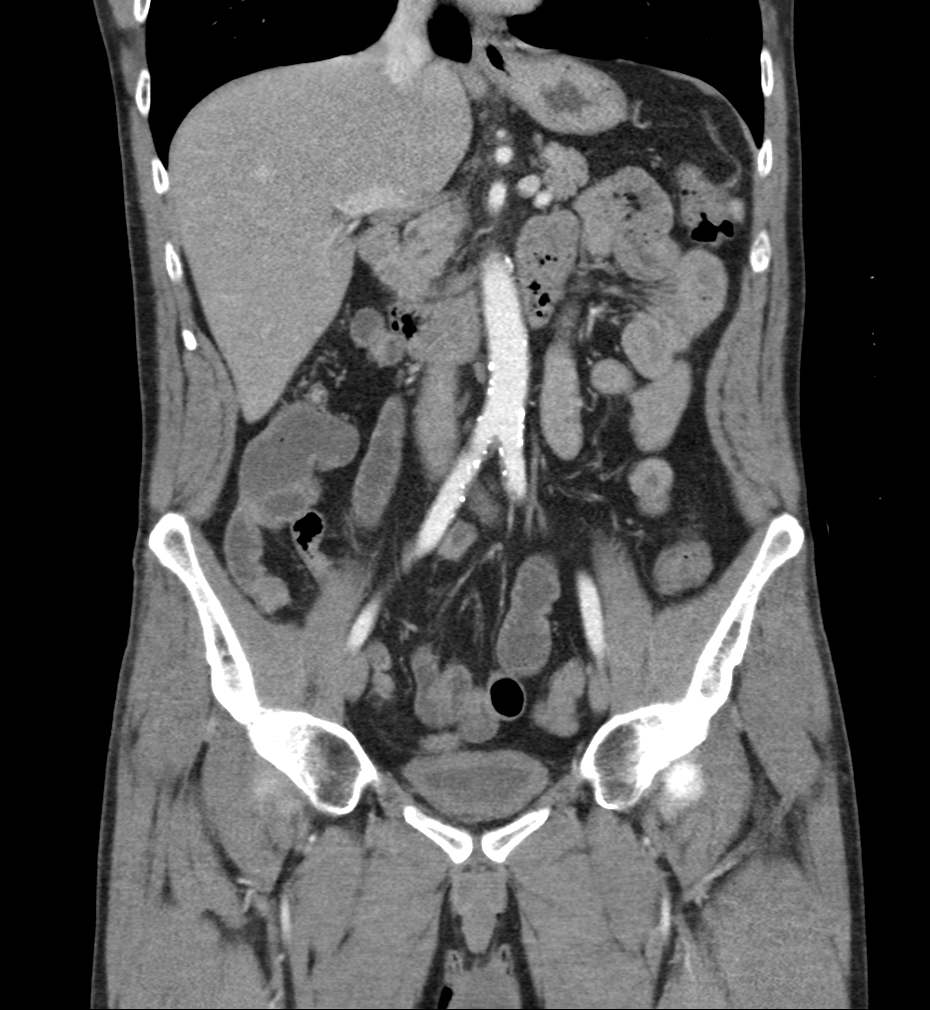
[im 61/110  soft-tissue]
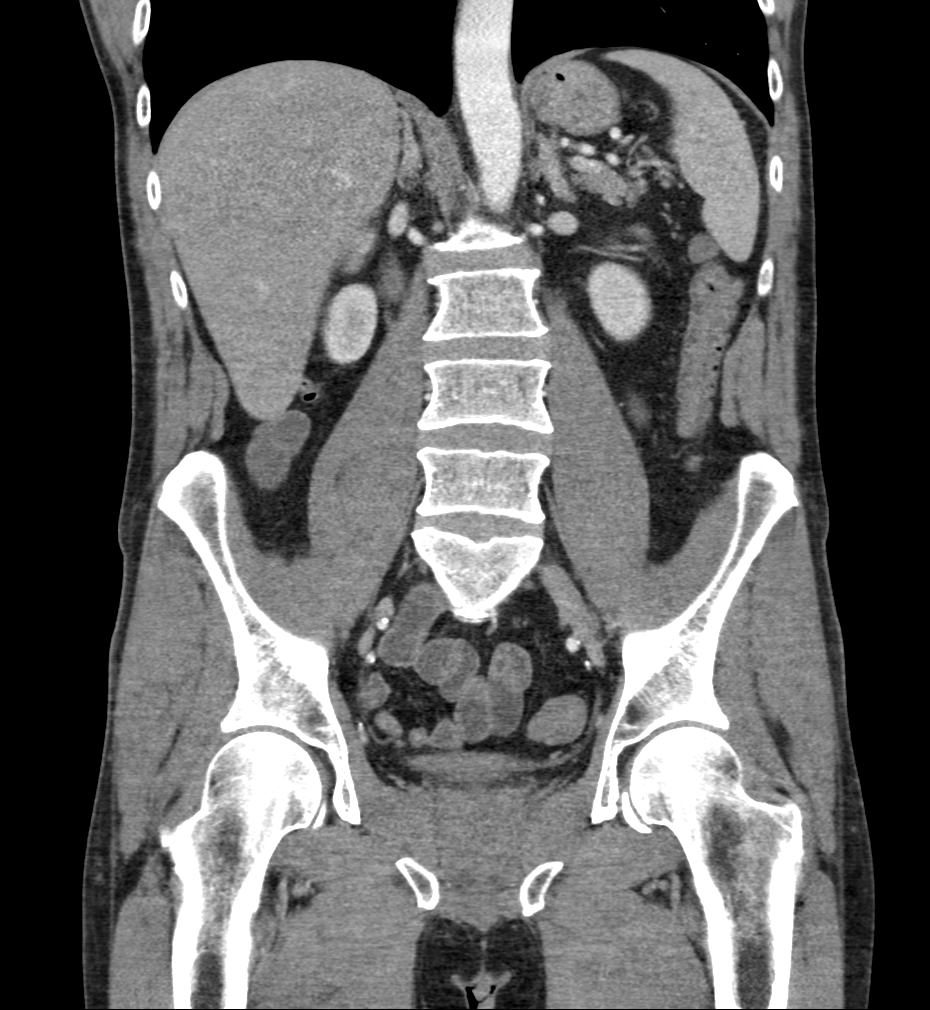

[11 of 46 positions shown; findings below may reference images not displayed]

FINDINGS: Lower chest:  No significant abnormality

Hepatobiliary: There are normal appearances of the liver,
gallbladder and bile ducts.

Pancreas: Normal

Spleen: Norm

Adrenals/Urinary Tract: The adrenals and kidneys are normal in
appearance. There is no urinary calculus evident. There is no
hydronephrosis or ureteral dilatation. Collecting systems and
ureters appear unremarkable.

Stomach/Bowel: There are normal appearances of the stomach, small
bowel and colon. The appendix is normal.

Vascular/Lymphatic: The abdominal aorta is normal in caliber. There
is mild atherosclerotic calcification. There is no adenopathy in the
abdomen or pelvis.

Reproductive: Unremarkable

Other: No acute inflammatory changes are evident in the abdomen or
pelvis. There is no ascites.

Musculoskeletal: No significant skeletal lesion.
IMPRESSION: No significant abnormality.

## 2016-09-15 ENCOUNTER — Encounter: Payer: Self-pay | Admitting: Family Medicine

## 2016-09-15 ENCOUNTER — Ambulatory Visit (INDEPENDENT_AMBULATORY_CARE_PROVIDER_SITE_OTHER): Payer: Self-pay | Admitting: Family Medicine

## 2016-09-15 VITALS — BP 133/78 | HR 78 | Temp 97.4°F | Ht 73.0 in | Wt 173.8 lb

## 2016-09-15 DIAGNOSIS — J189 Pneumonia, unspecified organism: Secondary | ICD-10-CM

## 2016-09-15 MED ORDER — AZITHROMYCIN 250 MG PO TABS: ORAL_TABLET | ORAL | 0 refills | Status: DC

## 2016-09-15 NOTE — Progress Notes (Signed)
   HPI  Patient presents today here with acute illness.  Patient explains her last 4 days or so he's developed sore throat, cough, congestion, and headache.  He states that his cough is productive of thick brown to black sputum, denies blood. Stopped smoking about 6 months ago.   He denies dyspnea and chest pain.   He denies any fever, chills, or sweats.   PMH: Smoking status noted ROS: Per HPI  Objective: BP 133/78   Pulse 78   Temp (!) 97.4 F (36.3 C) (Oral)   Ht 6\' 1"  (1.854 m)   Wt 173 lb 12.8 oz (78.8 kg)   BMI 22.93 kg/m  Gen: NAD, alert, cooperative with exam HEENT: NCAT, pharynx moist and clear, nares with swollen turbinates, TMs normal bilaterally CV: RRR, good S1/S2, no murmur Resp: Nonlabored, good air movement, rhonchi in the right lower lung field and left midlung field Ext: No edema, warm Neuro: Alert and oriented, No gross deficits  Assessment and plan:  # Walking pneumonia Treat with azithromycin Lung exam is reassuring, however he does have rhonchi Low threshold for return with unusual colored sputum, consider sulture if worsening ( self pay so work up was minimized today)  RTC with any concerns   Meds ordered this encounter  Medications  . azithromycin (ZITHROMAX) 250 MG tablet    Sig: Take 2 tablets on day 1 and 1 tablet daily after that    Dispense:  6 tablet    Refill:  0    Murtis SinkSam Taylin Leder, MD [facility] 09/15/2016, 12:25 PM

## 2016-09-15 NOTE — Patient Instructions (Signed)
Great to see you!  Come back with any questions.   Be sure to finish all antibiotics

## 2016-10-13 ENCOUNTER — Encounter: Payer: Self-pay | Admitting: Family Medicine

## 2016-10-13 ENCOUNTER — Ambulatory Visit (INDEPENDENT_AMBULATORY_CARE_PROVIDER_SITE_OTHER): Payer: Self-pay | Admitting: Family Medicine

## 2016-10-13 VITALS — BP 135/79 | HR 84 | Temp 97.1°F | Ht 73.0 in | Wt 178.0 lb

## 2016-10-13 DIAGNOSIS — L409 Psoriasis, unspecified: Secondary | ICD-10-CM

## 2016-10-13 MED ORDER — TRIAMCINOLONE ACETONIDE 0.1 % EX CREA: 1.0000 "application " | TOPICAL_CREAM | Freq: Two times a day (BID) | CUTANEOUS | 2 refills | Status: DC

## 2016-10-13 MED ORDER — PREDNISONE 20 MG PO TABS: ORAL_TABLET | ORAL | 0 refills | Status: DC

## 2016-10-13 NOTE — Progress Notes (Signed)
BP 135/79   Pulse 84   Temp (!) 97.1 F (36.2 C) (Oral)   Ht  (1.854 m)   Wt 178 lb (80.7 kg)   BMI 23.48 kg/m    Subjective:    Patient ID: John Banks, male    DOB: Sep 25, 1960, 56 y.o.   MRN: [id number]  HPI: John Banks is a 56 y.o. male presenting on 10/13/2016 for an intensely itchy and burning rash on his upper back bilaterally that has been gradually worsening for the past month and recently spread to the backs of his arms bilaterally. He reports trying calamine lotion and neosporin antibiotic ointment with temporary relief of itching. He reports getting a bad sunburn before this began while building a swimming pool. He has never had a rash like this before. He has not been in the woods or around any poison ivy. He remembers no insect bites. He has no family history of rashes. He denies fever, chills, nausea, and vomiting.   HPI  Relevant past medical, surgical, family and social history reviewed and updated as indicated. Interim medical history since our last visit reviewed. Allergies and medications reviewed and updated.   Review of Systems  Constitutional: Negative for chills, diaphoresis and fever.  HENT: Negative for congestion, rhinorrhea, sinus pain, sinus pressure and sneezing.   Respiratory: Negative for cough, shortness of breath and wheezing.   Cardiovascular: Negative for chest pain, palpitations and leg swelling.  Gastrointestinal: Negative for abdominal pain, constipation, diarrhea, nausea and vomiting.  Musculoskeletal: Negative for arthralgias, back pain and myalgias.  Skin: Positive for color change and rash (red itchy rash on upper back and posterior upper arm). Negative for pallor and wound.  Neurological: Negative for tremors, weakness, numbness and headaches.  Psychiatric/Behavioral: Negative for agitation, behavioral problems and confusion.    Per HPI unless specifically indicated above     Objective:    BP 135/79   Pulse 84   Temp (!)  97.1 F (36.2 C) (Oral)   Ht  (1.854 m)   Wt 178 lb (80.7 kg)   BMI 23.48 kg/m   Wt Readings from Last 3 Encounters:  10/13/16 178 lb (80.7 kg)  09/15/16 173 lb 12.8 oz (78.8 kg)  06/08/16 167 lb (75.8 kg)    Physical Exam  Constitutional: He is oriented to person, place, and time. He appears well-developed and well-nourished. No distress.  HENT:  Head: Normocephalic and atraumatic.  Mouth/Throat: Oropharynx is clear and moist. No oropharyngeal exudate.  Eyes: Pupils are equal, round, and reactive to light. Conjunctivae and EOM are normal. Right eye exhibits no discharge. Left eye exhibits no discharge. No scleral icterus.  Neck: Normal range of motion. Neck supple. No thyromegaly present.  Cardiovascular: Normal rate, regular rhythm and normal heart sounds.   No murmur heard. Pulmonary/Chest: Effort normal and breath sounds normal. No respiratory distress. He has no wheezes. He has no rales. He exhibits no tenderness.  Abdominal: Soft. Bowel sounds are normal. There is no tenderness.  Musculoskeletal: Normal range of motion. He exhibits no tenderness.  Neurological: He is alert and oriented to person, place, and time. He has normal reflexes.  Skin: Skin is warm and dry. Rash (large erythematous scaling plaque across entire superior back and posteror upper arms, white calamine lotion visible) noted. He is not diaphoretic. There is erythema. No pallor.  Psychiatric: He has a normal mood and affect. His behavior is normal. Judgment and thought content normal.  Nursing note and vitals reviewed.  Assessment & Plan:   Problem List Items Addressed This Visit    None    Visit Diagnoses    Psoriasis    -  Primary   Relevant Medications   predniSONE (DELTASONE) 20 MG tablet   triamcinolone cream (KENALOG) 0.1 %      John Banks is a 56 y.o. male presenting on 10/13/2016 for an intensely itchy and burning rash on his upper back bilaterally that has been gradually worsening  for the past month and recently spread to the backs of his arms bilaterally. On exam he has a large erythematous scaling plaque across entire superior back and posteror upper arms, with white calamine lotion visible. This is most likely psoriasis. I will prescribe a 5 day prednisone burst for him, and triamcinolone cream. I explained how to use these medications. I told him that no follow-up was necessary if the rash improves on this medication, but that he should return if he develops fever, chills, or pustular drainage. I also explained that the next step would be a biopsy to definitively diagnose psoriasis if it clears up with the medication but returns and he is agreeable to this.   Follow up plan: Return if symptoms worsen or fail to improve.  Patient seen and examined with Havery Moros medical student. Agree with assessment and plan above. Agreed that is likely atopic dermatitis versus early psoriasis. Arville Care, MD [facility] 10/13/2016, 9:36 AM

## 2016-10-13 NOTE — Patient Instructions (Signed)
Psoriasis  Psoriasis is a long-term (chronic) skin condition. It causes raised, red patches (plaques) on your skin that look silvery. The red patches may show up anywhere on your body. They can be any size or shape.  Psoriasis can come and go. It can range from mild to very bad. It cannot be passed from one person to another (not contagious). There is no cure for this condition, but it can be helped with treatment.  Follow these instructions at home:  Skin Care   Apply moisturizers to your skin as needed. Only use those that your doctor has said are okay.   Apply cool compresses to the affected areas.   Do not scratch your skin.  Lifestyle     Do not use tobacco products. This includes cigarettes, chewing tobacco, and e-cigarettes. If you need help quitting, ask your doctor.   Drink little or no alcohol.   Try to lower your stress. Meditation or yoga may help.   Get sun as told by your doctor. Do not get sunburned.   Think about joining a psoriasis support group.  Medicines   Take or use over-the-counter and prescription medicines only as told by your doctor.   If you were prescribed an antibiotic, take or use it as told by your doctor. Do not stop taking the antibiotic even if your condition starts to get better.  General instructions   Keep a journal. Use this to help track what triggers an outbreak. Try to avoid any triggers.   See a counselor or social worker if you feel very sad, upset, or hopeless about your condition and these feelings affect your work or relationships.   Keep all follow-up visits as told by your doctor. This is important.  Contact a doctor if:   Your pain gets worse.   You have more redness or warmth in the affected areas.   You have new pain or stiffness in your joints.   Your pain or stiffness in your joints gets worse.   Your nails start to break easily.   Your nails pull away from the nail bed easily.   You have a fever.   You feel very sad (depressed).  This  information is not intended to replace advice given to you by your health care provider. Make sure you discuss any questions you have with your health care provider.  Document Released: 01/28/2004 Document Revised: 05/28/2015 Document Reviewed: 05/07/2014  Elsevier Interactive Patient Education  2018 [employer] Inc[employer]

## 2017-06-09 ENCOUNTER — Ambulatory Visit: Payer: Self-pay | Admitting: Family Medicine

## 2017-06-12 ENCOUNTER — Encounter: Payer: Self-pay | Admitting: Family Medicine

## 2017-06-12 ENCOUNTER — Ambulatory Visit: Payer: Self-pay | Admitting: Family Medicine

## 2017-06-12 VITALS — BP 137/88 | HR 75 | Temp 97.3°F | Ht 73.0 in | Wt 182.0 lb

## 2017-06-12 DIAGNOSIS — M7551 Bursitis of right shoulder: Secondary | ICD-10-CM

## 2017-06-12 DIAGNOSIS — E785 Hyperlipidemia, unspecified: Secondary | ICD-10-CM

## 2017-06-12 DIAGNOSIS — Z0000 Encounter for general adult medical examination without abnormal findings: Secondary | ICD-10-CM

## 2017-06-12 DIAGNOSIS — I10 Essential (primary) hypertension: Secondary | ICD-10-CM

## 2017-06-12 LAB — CMP14+EGFR
A/G RATIO: 1.9 (ref 1.2–2.2)
ALK PHOS: 73 IU/L (ref 39–117)
ALT: 35 IU/L (ref 0–44)
AST: 33 IU/L (ref 0–40)
Albumin: 4.5 g/dL (ref 3.5–5.5)
BUN/Creatinine Ratio: 12 (ref 9–20)
BUN: 12 mg/dL (ref 6–24)
Bilirubin Total: 0.2 mg/dL (ref 0.0–1.2)
CO2: 26 mmol/L (ref 20–29)
Calcium: 10 mg/dL (ref 8.7–10.2)
Chloride: 102 mmol/L (ref 96–106)
Creatinine, Ser: 1.02 mg/dL (ref 0.76–1.27)
GFR calc non Af Amer: 82 mL/min/{1.73_m2} (ref >59)
GFR, EST AFRICAN AMERICAN: 95 mL/min/{1.73_m2} (ref >59)
GLUCOSE: 80 mg/dL (ref 65–99)
Globulin, Total: 2.4 g/dL (ref 1.5–4.5)
POTASSIUM: 4.3 mmol/L (ref 3.5–5.2)
Sodium: 142 mmol/L (ref 134–144)
Total Protein: 6.9 g/dL (ref 6.0–8.5)

## 2017-06-12 LAB — LIPID PANEL
Chol/HDL Ratio: 5.2 ratio — ABNORMAL HIGH (ref 0.0–5.0)
Cholesterol, Total: 207 mg/dL — ABNORMAL HIGH (ref 100–199)
HDL: 40 mg/dL (ref >39)
LDL Calculated: 121 mg/dL — ABNORMAL HIGH (ref 0–99)
Triglycerides: 231 mg/dL — ABNORMAL HIGH (ref 0–149)
VLDL Cholesterol Cal: 46 mg/dL — ABNORMAL HIGH (ref 5–40)

## 2017-06-12 MED ORDER — METHYLPREDNISOLONE ACETATE 80 MG/ML IJ SUSP
80.0000 mg | Freq: Once | INTRAMUSCULAR | Status: AC
  Administered 2017-06-12: 80 mg via INTRAMUSCULAR

## 2017-06-12 NOTE — Progress Notes (Signed)
BP 137/88   Pulse 75   Temp (!) 97.3 F (36.3 C) (Oral)   Ht '6\' 1"'$  (1.854 m)   Wt 182 lb (82.6 kg)   BMI 24.01 kg/m    Subjective:    Patient ID: John Banks, male    DOB: 31-Oct-1960, 57 y.o.   MRN: [id number]  HPI: John Banks is a 57 y.o. male presenting on 06/12/2017 for Annual Exam and Right shoulder pain (no known injury; has been using [facility], aspirin and OTC pain patches)   HPI Well adult exam Patient comes in with adult well exam and physical and to do labs.  He says he is doing very well healthwise except for his right shoulder has been hurting him.  He says the right shoulder has been hurting him over the past 1 month or maybe a month and a half.  He says is a burning sensation in the shoulder that improved with Tylenol but is worsened by range of motion and especially when he is working on his job.  He also says that has been hurting him enough that it wakes him up at night sometimes.  He denies any fevers or chills or numbness or weakness.  It mainly hurts on the lateral aspect of the shoulder and is a burning sensation that sometimes goes down the lateral aspect of his upper arm.  He rates the pain as mild at times and moderate at other times .patient denies any chest pain, shortness of breath, headaches or vision issues, abdominal complaints, diarrhea, nausea, vomiting, or joint issues.   Hypertension Patient is currently on no medication and is diet controlled, and their blood pressure today is 137/88. Patient denies any lightheadedness or dizziness. Patient denies headaches, blurred vision, chest pains, shortness of breath, or weakness. Denies any side effects from medication and is content with current medication.   Hyperlipidemia Patient is coming in for recheck of his hyperlipidemia. The patient is currently taking no medication and is diet controlled and we are monitoring for now.. They deny any issues with myalgias or history of liver damage from it. They deny any  focal numbness or weakness or chest pain.   Relevant past medical, surgical, family and social history reviewed and updated as indicated. Interim medical history since our last visit reviewed. Allergies and medications reviewed and updated.  Review of Systems  Constitutional: Negative for chills and fever.  Eyes: Negative for discharge.  Respiratory: Negative for shortness of breath and wheezing.   Cardiovascular: Negative for chest pain and leg swelling.  Gastrointestinal: Negative for abdominal pain.  Musculoskeletal: Positive for arthralgias. Negative for back pain and gait problem.  Skin: Negative for rash.  Neurological: Negative for dizziness, weakness and numbness.  All other systems reviewed and are negative.   Per HPI unless specifically indicated above   Allergies as of 06/12/2017      Reactions   Sulfa Antibiotics Anaphylaxis, Shortness Of Breath, Nausea And Vomiting   Patient stated that he was also "paralyzed" and had to be hospitalized      Medication List        Accurate as of 06/12/17  8:41 AM. Always use your most recent med list.          nitroGLYCERIN 0.4 MG SL tablet Commonly known as:  NITROSTAT Place 1 tablet (0.4 mg total) under the tongue every 5 (five) minutes as needed for chest pain.          Objective:    BP 137/88  Pulse 75   Temp (!) 97.3 F (36.3 C) (Oral)   Ht '6\' 1"'$  (1.854 m)   Wt 182 lb (82.6 kg)   BMI 24.01 kg/m   Wt Readings from Last 3 Encounters:  06/12/17 182 lb (82.6 kg)  10/13/16 178 lb (80.7 kg)  09/15/16 173 lb 12.8 oz (78.8 kg)    Physical Exam  Constitutional: He is oriented to person, place, and time. He appears well-developed and well-nourished. No distress.  Eyes: Conjunctivae are normal. Right eye exhibits no discharge. No scleral icterus.  Neck: Neck supple. No thyromegaly present.  Cardiovascular: Normal rate, regular rhythm, normal heart sounds and intact distal pulses.  No murmur heard. Pulmonary/Chest:  Effort normal and breath sounds normal. No respiratory distress. He has no wheezes.  Musculoskeletal: Normal range of motion. He exhibits no edema.       Right shoulder: He exhibits tenderness (Lateral shoulder tenderness) and crepitus (Slight crepitus in the upper shoulder). He exhibits normal range of motion, no effusion, no deformity and normal strength.  Lymphadenopathy:    He has no cervical adenopathy.  Neurological: He is alert and oriented to person, place, and time. Coordination normal.  Skin: Skin is warm and dry. No rash noted. He is not diaphoretic.  Psychiatric: He has a normal mood and affect. His behavior is normal.  Nursing note and vitals reviewed.   Knee injection: Consent form signed. Risk factors of bleeding and infection discussed with patient and patient is agreeable towards injection. Patient prepped with Betadine. Lateral approach towards injection used. Injected 80 mg of Depo-Medrol and 1 mL of 2% lidocaine. Patient tolerated procedure well and no side effects from noted. Minimal to no bleeding. Simple bandage applied after.     Assessment & Plan:   Problem List Items Addressed This Visit      Cardiovascular and Mediastinum   Essential hypertension, benign   Relevant Orders   CMP14+EGFR     Other   Hyperlipidemia LDL goal <130   Relevant Orders   Lipid panel    Other Visit Diagnoses    Well adult exam    -  Primary   Subacromial bursitis of right shoulder joint       Relevant Medications   methylPREDNISolone acetate (DEPO-MEDROL) injection 80 mg      The 10-year ASCVD risk score Mikey Bussing DC Jr., et al., 2013) is: 15.7%   Values used to calculate the score:     Age: 77 years     Sex: Male     Is Non-Hispanic African American: No     Diabetic: No     Tobacco smoker: Yes     Systolic Blood Pressure: 951 mmHg     Is BP treated: Yes     HDL Cholesterol: 45 mg/dL     Total Cholesterol: 201 mg/dL   Follow up plan: Return in about 1 year (around  06/13/2018), or if symptoms worsen or fail to improve, for Well adult exam.  Counseling provided for all of the vaccine components No orders of the defined types were placed in this encounter.   Caryl Pina, MD [facility] 06/12/2017, 8:41 AM

## 2017-08-07 ENCOUNTER — Telehealth: Payer: Self-pay | Admitting: Family Medicine

## 2017-08-07 DIAGNOSIS — G8929 Other chronic pain: Secondary | ICD-10-CM

## 2017-08-07 DIAGNOSIS — M25511 Pain in right shoulder: Secondary | ICD-10-CM

## 2017-08-07 DIAGNOSIS — M7551 Bursitis of right shoulder: Secondary | ICD-10-CM

## 2017-08-07 NOTE — Telephone Encounter (Signed)
Pt called and aware of Dettinger recommendations by detailed VM - referral also placed  Aware to call for apt if he wants change in meds

## 2017-08-07 NOTE — Telephone Encounter (Signed)
Patient has started having pain in his right shoulder again.  Wanted to make an appointment to have it injected again, but his last injection was in June, so I told him it was too early.  He is taking extra strength tylenol but it does not seem to help.  He would like to know if there is something else that you could prescribe.  Also, he would like to be referred to an orthopedic doctor for this.  Please advise.  Pharmacy is [facility].

## 2017-08-07 NOTE — Telephone Encounter (Signed)
Unfortunately there is not a whole lot that I would recommend at this point for his shoulder pain likely than Tylenol because of his history of GI bleeds we are quite limited on what we can give him.  Go ahead and do the referral to Ortho, we can do the injection after 4 months but I would not recommend to do it more frequently.  If it continues to get worse we could consider tramadol in the future but it have to get that from an appointment

## 2017-08-16 ENCOUNTER — Ambulatory Visit (INDEPENDENT_AMBULATORY_CARE_PROVIDER_SITE_OTHER): Payer: Self-pay | Admitting: Orthopaedic Surgery

## 2017-08-16 ENCOUNTER — Encounter (INDEPENDENT_AMBULATORY_CARE_PROVIDER_SITE_OTHER): Payer: Self-pay | Admitting: Orthopaedic Surgery

## 2017-08-16 ENCOUNTER — Ambulatory Visit (INDEPENDENT_AMBULATORY_CARE_PROVIDER_SITE_OTHER): Payer: Self-pay

## 2017-08-16 DIAGNOSIS — M25511 Pain in right shoulder: Secondary | ICD-10-CM

## 2017-08-16 NOTE — Progress Notes (Signed)
Office Visit Note   Patient: John Banks           Date of Birth: 06/24/1960           MRN: [id number] Visit Date: 08/16/2017              Requested by: Louanne Skyeettinger, Elige RadonJoshua A, MD [address] [place], [place] [address] PCP: Dettinger, Elige RadonJoshua A, MD   Assessment & Plan: Visit Diagnoses:  1. Right shoulder pain, unspecified chronicity     Plan: Impression is continued right shoulder pain.  He had a subacromial injection back in June which was over 6 weeks ago which gave him temporary relief.  At this point his pain has returned and we need to obtain an MRI to assess for rotator cuff tear.  Follow-up after the MRI.  Follow-Up Instructions: Return in about 10 days (around 08/26/2017).   Orders:  Orders Placed This Encounter  Procedures  . XR Shoulder Right   No orders of the defined types were placed in this encounter.     Procedures: No procedures performed   Clinical Data: No additional findings.   Subjective: Chief Complaint  Patient presents with  . Right Shoulder - Pain    John Banks is a very pleasant 57 year old gentleman who comes in with 6 to 4362-month history of right shoulder pain.  Denies any injuries.  Denies any radicular symptoms.  The pain is constant and is worse with use and elevation above his head.   Review of Systems  Constitutional: Negative.   All other systems reviewed and are negative.    Objective: Vital Signs: There were no vitals taken for this visit.  Physical Exam  Constitutional: He is oriented to person, place, and time. He appears well-developed and well-nourished.  HENT:  Head: Normocephalic and atraumatic.  Eyes: Pupils are equal, round, and reactive to light.  Neck: Neck supple.  Pulmonary/Chest: Effort normal.  Abdominal: Soft.  Musculoskeletal: Normal range of motion.  Neurological: He is alert and oriented to person, place, and time.  Skin: Skin is warm.  Psychiatric: He has a normal mood and affect. His behavior is normal.  Judgment and thought content normal.  Nursing note and vitals reviewed.   Ortho Exam Right shoulder exam shows pain with empty can and infraspinatus testing with mild weakness.  No significant impingement signs.  Mildly positive crank test. Specialty Comments:  No specialty comments available.  Imaging: Xr Shoulder Right  Result Date: 08/16/2017 No acute or structural abnormalities.  Small posterior glenoid spur.     PMFS History: Patient Active Problem List   Diagnosis Date Noted  . Essential hypertension, benign 04/29/2015  . Hyperlipidemia LDL goal <130 04/29/2015   Past Medical History:  Diagnosis Date  . Asthma   . COPD (chronic obstructive pulmonary disease) (HCC)   . Emphysema of lung (HCC)   . Hyperlipidemia     Family History  Problem Relation Age of Onset  . Stroke Maternal Grandfather   . Diabetes Father   . Leukemia Sister   . Autoimmune disease Neg Hx     Past Surgical History:  Procedure Laterality Date  . CARDIAC CATHETERIZATION    . COLONOSCOPY W/ BIOPSIES  07/2014   [facility] For [facility].  biopsies revealed microscopic colitis.   Marland Kitchen. ESOPHAGOGASTRODUODENOSCOPY N/A 09/01/2015   Procedure: ESOPHAGOGASTRODUODENOSCOPY (EGD);  Surgeon: Napoleon FormKavitha V Nandigam, MD;  [facility];  Service: Endoscopy;  Laterality: N/A;   Social History   Occupational History  .  Not on file  Tobacco Use  . Smoking status: Former Smoker    Packs/day: 2.00    Years: 40.00    Pack years: 80.00    Types: Cigarettes    Last attempt to quit: 04/19/2016    Years since quitting: 1.3  . Smokeless tobacco: Never Used  Substance and Sexual Activity  . Alcohol use: Yes    Alcohol/week: 12.0 standard drinks    Types: 12 Cans of beer per week    Comment: per week  . Drug use: Yes    Frequency: 7.0 times per week    Types: Marijuana    Comment: daily  . Sexual activity: Yes

## 2017-08-16 NOTE — Addendum Note (Signed)
Addended by: Albertina ParrGARCIA, Kenyada Dosch on: 08/16/2017 03:25 PM   Modules accepted: Orders

## 2017-08-29 ENCOUNTER — Ambulatory Visit (INDEPENDENT_AMBULATORY_CARE_PROVIDER_SITE_OTHER): Payer: Self-pay | Admitting: Orthopaedic Surgery

## 2018-05-10 ENCOUNTER — Telehealth: Payer: Self-pay | Admitting: Family Medicine

## 2018-06-14 ENCOUNTER — Encounter: Payer: Self-pay | Admitting: Family Medicine

## 2019-03-19 ENCOUNTER — Encounter: Payer: Self-pay | Admitting: Family Medicine

## 2019-03-19 ENCOUNTER — Ambulatory Visit (INDEPENDENT_AMBULATORY_CARE_PROVIDER_SITE_OTHER): Payer: Self-pay | Admitting: Family Medicine

## 2019-03-19 DIAGNOSIS — J40 Bronchitis, not specified as acute or chronic: Secondary | ICD-10-CM

## 2019-03-19 DIAGNOSIS — J329 Chronic sinusitis, unspecified: Secondary | ICD-10-CM

## 2019-03-19 MED ORDER — AZITHROMYCIN 250 MG PO TABS: ORAL_TABLET | ORAL | 0 refills | Status: AC

## 2019-03-19 NOTE — Progress Notes (Signed)
    Subjective:    Patient ID: Papa Piercefield, male    DOB: 05/14/1960, 59 y.o.   MRN: [id number]   HPI: Nygel Prokop is a 59 y.o. male presenting for slight aching in his chest. He says he got sore throat from his mother. Settled in the lungs. Cough with green sputum. Fever, subjective,  at onset about 10 days ago. Some relief with nyquil.    Depression screen [facility] 2/9 06/12/2017 10/13/2016 09/15/2016 06/08/2016 12/09/2015  Decreased Interest 0 0 0 0 0  Down, Depressed, Hopeless 0 0 0 0 0  PHQ - 2 Score 0 0 0 0 0     Relevant past medical, surgical, family and social history reviewed and updated as indicated.  Interim medical history since our last visit reviewed. Allergies and medications reviewed and updated.  ROS:  Review of Systems   Social History   Tobacco Use  Smoking Status Former Smoker  . Packs/day: 2.00  . Years: 40.00  . Pack years: 80.00  . Types: Cigarettes  . Quit date: 04/19/2016  . Years since quitting: 2.9  Smokeless Tobacco Never Used       Objective:     Wt Readings from Last 3 Encounters:  06/12/17 182 lb (82.6 kg)  10/13/16 178 lb (80.7 kg)  09/15/16 173 lb 12.8 oz (78.8 kg)     Exam deferred. Pt. Harboring due to COVID 19. Phone visit performed.   Assessment & Plan:   1. Sinobronchitis     Meds ordered this encounter  Medications  . azithromycin (ZITHROMAX Z-PAK) 250 MG tablet    Sig: Take two right away Then one a day for the next 4 days.    Dispense:  6 each    Refill:  0    No orders of the defined types were placed in this encounter.     Diagnoses and all orders for this visit:  Sinobronchitis  Other orders -     azithromycin (ZITHROMAX Z-PAK) 250 MG tablet; Take two right away Then one a day for the next 4 days.    Virtual Visit via telephone Note  I discussed the limitations, risks, security and privacy concerns of performing an evaluation and management service by telephone and the availability of in person  appointments. The patient was identified with two identifiers. Pt.expressed understanding and agreed to proceed. Pt. Is at home. Dr. Darlyn Read is in his office.  Follow Up Instructions:   I discussed the assessment and treatment plan with the patient. The patient was provided an opportunity to ask questions and all were answered. The patient agreed with the plan and demonstrated an understanding of the instructions.   The patient was advised to call back or seek an in-person evaluation if the symptoms worsen or if the condition fails to improve as anticipated.   Total minutes including chart review and phone contact time: 8   Follow up plan: Return if symptoms worsen or fail to improve.  Mechele Claude, MD [place] [facility]
